# Patient Record
Sex: Female | Born: 1950
Health system: Southern US, Community
[De-identification: ages and names within clinical notes are randomized; demographics above are authoritative.]

## PROBLEM LIST (undated history)

## (undated) DIAGNOSIS — I10 Essential (primary) hypertension: Secondary | ICD-10-CM

## (undated) DIAGNOSIS — M810 Age-related osteoporosis without current pathological fracture: Secondary | ICD-10-CM

## (undated) HISTORY — DX: Essential (primary) hypertension: I10

## (undated) HISTORY — DX: Age-related osteoporosis without current pathological fracture: M81.0

## (undated) HISTORY — PX: NO PAST SURGERIES: SHX2092

---

## 2014-12-31 DIAGNOSIS — Z6831 Body mass index (BMI) 31.0-31.9, adult: Secondary | ICD-10-CM | POA: Diagnosis not present

## 2014-12-31 DIAGNOSIS — S30861A Insect bite (nonvenomous) of abdominal wall, initial encounter: Secondary | ICD-10-CM | POA: Diagnosis not present

## 2014-12-31 DIAGNOSIS — R509 Fever, unspecified: Secondary | ICD-10-CM | POA: Diagnosis not present

## 2015-01-14 DIAGNOSIS — Z0000 Encounter for general adult medical examination without abnormal findings: Secondary | ICD-10-CM | POA: Diagnosis not present

## 2015-01-14 DIAGNOSIS — Z1231 Encounter for screening mammogram for malignant neoplasm of breast: Secondary | ICD-10-CM | POA: Diagnosis not present

## 2015-01-14 DIAGNOSIS — I10 Essential (primary) hypertension: Secondary | ICD-10-CM | POA: Diagnosis not present

## 2015-01-14 DIAGNOSIS — Z720 Tobacco use: Secondary | ICD-10-CM | POA: Diagnosis not present

## 2015-01-14 DIAGNOSIS — Z6832 Body mass index (BMI) 32.0-32.9, adult: Secondary | ICD-10-CM | POA: Diagnosis not present

## 2015-01-14 DIAGNOSIS — Z716 Tobacco abuse counseling: Secondary | ICD-10-CM | POA: Diagnosis not present

## 2015-01-20 DIAGNOSIS — R1909 Other intra-abdominal and pelvic swelling, mass and lump: Secondary | ICD-10-CM | POA: Diagnosis not present

## 2015-01-20 DIAGNOSIS — Z1231 Encounter for screening mammogram for malignant neoplasm of breast: Secondary | ICD-10-CM | POA: Diagnosis not present

## 2015-01-20 DIAGNOSIS — Z1211 Encounter for screening for malignant neoplasm of colon: Secondary | ICD-10-CM | POA: Diagnosis not present

## 2015-01-20 DIAGNOSIS — I716 Thoracoabdominal aortic aneurysm, without rupture: Secondary | ICD-10-CM | POA: Diagnosis not present

## 2015-01-20 DIAGNOSIS — R1900 Intra-abdominal and pelvic swelling, mass and lump, unspecified site: Secondary | ICD-10-CM | POA: Diagnosis not present

## 2015-01-23 DIAGNOSIS — R928 Other abnormal and inconclusive findings on diagnostic imaging of breast: Secondary | ICD-10-CM | POA: Diagnosis not present

## 2015-01-23 DIAGNOSIS — R922 Inconclusive mammogram: Secondary | ICD-10-CM | POA: Diagnosis not present

## 2015-02-11 ENCOUNTER — Encounter: Payer: Self-pay | Admitting: Legal Medicine

## 2015-02-11 DIAGNOSIS — K621 Rectal polyp: Secondary | ICD-10-CM | POA: Diagnosis not present

## 2015-02-11 DIAGNOSIS — D128 Benign neoplasm of rectum: Secondary | ICD-10-CM | POA: Diagnosis not present

## 2015-02-11 DIAGNOSIS — K5730 Diverticulosis of large intestine without perforation or abscess without bleeding: Secondary | ICD-10-CM | POA: Diagnosis not present

## 2015-02-11 DIAGNOSIS — Z1211 Encounter for screening for malignant neoplasm of colon: Secondary | ICD-10-CM | POA: Diagnosis not present

## 2015-02-11 DIAGNOSIS — D125 Benign neoplasm of sigmoid colon: Secondary | ICD-10-CM | POA: Diagnosis not present

## 2015-02-11 LAB — HM COLONOSCOPY

## 2015-03-18 DIAGNOSIS — Z6831 Body mass index (BMI) 31.0-31.9, adult: Secondary | ICD-10-CM | POA: Diagnosis not present

## 2015-03-18 DIAGNOSIS — I10 Essential (primary) hypertension: Secondary | ICD-10-CM | POA: Diagnosis not present

## 2015-09-24 DIAGNOSIS — I10 Essential (primary) hypertension: Secondary | ICD-10-CM | POA: Diagnosis not present

## 2015-09-24 DIAGNOSIS — F17200 Nicotine dependence, unspecified, uncomplicated: Secondary | ICD-10-CM | POA: Diagnosis not present

## 2015-09-24 DIAGNOSIS — D649 Anemia, unspecified: Secondary | ICD-10-CM | POA: Diagnosis not present

## 2015-09-24 DIAGNOSIS — Z6831 Body mass index (BMI) 31.0-31.9, adult: Secondary | ICD-10-CM | POA: Diagnosis not present

## 2015-09-24 DIAGNOSIS — E669 Obesity, unspecified: Secondary | ICD-10-CM | POA: Diagnosis not present

## 2016-03-23 DIAGNOSIS — Z716 Tobacco abuse counseling: Secondary | ICD-10-CM | POA: Diagnosis not present

## 2016-03-23 DIAGNOSIS — Z6831 Body mass index (BMI) 31.0-31.9, adult: Secondary | ICD-10-CM | POA: Diagnosis not present

## 2016-03-23 DIAGNOSIS — E669 Obesity, unspecified: Secondary | ICD-10-CM | POA: Diagnosis not present

## 2016-03-23 DIAGNOSIS — I10 Essential (primary) hypertension: Secondary | ICD-10-CM | POA: Diagnosis not present

## 2016-03-23 DIAGNOSIS — E785 Hyperlipidemia, unspecified: Secondary | ICD-10-CM | POA: Diagnosis not present

## 2016-03-23 DIAGNOSIS — F17200 Nicotine dependence, unspecified, uncomplicated: Secondary | ICD-10-CM | POA: Diagnosis not present

## 2016-04-05 DIAGNOSIS — Z1231 Encounter for screening mammogram for malignant neoplasm of breast: Secondary | ICD-10-CM | POA: Diagnosis not present

## 2016-04-19 DIAGNOSIS — Z0000 Encounter for general adult medical examination without abnormal findings: Secondary | ICD-10-CM | POA: Diagnosis not present

## 2016-04-19 DIAGNOSIS — Z6831 Body mass index (BMI) 31.0-31.9, adult: Secondary | ICD-10-CM | POA: Diagnosis not present

## 2016-09-26 DIAGNOSIS — I10 Essential (primary) hypertension: Secondary | ICD-10-CM | POA: Diagnosis not present

## 2016-09-26 DIAGNOSIS — F5101 Primary insomnia: Secondary | ICD-10-CM | POA: Diagnosis not present

## 2017-03-27 DIAGNOSIS — I10 Essential (primary) hypertension: Secondary | ICD-10-CM | POA: Diagnosis not present

## 2017-03-27 DIAGNOSIS — M810 Age-related osteoporosis without current pathological fracture: Secondary | ICD-10-CM | POA: Diagnosis not present

## 2017-04-17 DIAGNOSIS — M8589 Other specified disorders of bone density and structure, multiple sites: Secondary | ICD-10-CM | POA: Diagnosis not present

## 2017-04-17 DIAGNOSIS — M810 Age-related osteoporosis without current pathological fracture: Secondary | ICD-10-CM | POA: Diagnosis not present

## 2017-04-17 LAB — HM DEXA SCAN

## 2017-04-19 DIAGNOSIS — Z0000 Encounter for general adult medical examination without abnormal findings: Secondary | ICD-10-CM | POA: Diagnosis not present

## 2017-04-19 DIAGNOSIS — Z6829 Body mass index (BMI) 29.0-29.9, adult: Secondary | ICD-10-CM | POA: Diagnosis not present

## 2017-04-19 DIAGNOSIS — Z23 Encounter for immunization: Secondary | ICD-10-CM | POA: Diagnosis not present

## 2017-09-27 DIAGNOSIS — M810 Age-related osteoporosis without current pathological fracture: Secondary | ICD-10-CM | POA: Diagnosis not present

## 2017-09-27 DIAGNOSIS — I10 Essential (primary) hypertension: Secondary | ICD-10-CM | POA: Diagnosis not present

## 2018-03-28 DIAGNOSIS — M810 Age-related osteoporosis without current pathological fracture: Secondary | ICD-10-CM | POA: Diagnosis not present

## 2018-03-28 DIAGNOSIS — I10 Essential (primary) hypertension: Secondary | ICD-10-CM | POA: Diagnosis not present

## 2018-03-28 DIAGNOSIS — Z6832 Body mass index (BMI) 32.0-32.9, adult: Secondary | ICD-10-CM | POA: Diagnosis not present

## 2018-04-24 DIAGNOSIS — Z23 Encounter for immunization: Secondary | ICD-10-CM | POA: Diagnosis not present

## 2018-04-24 DIAGNOSIS — Z0000 Encounter for general adult medical examination without abnormal findings: Secondary | ICD-10-CM | POA: Diagnosis not present

## 2018-04-24 DIAGNOSIS — Z6832 Body mass index (BMI) 32.0-32.9, adult: Secondary | ICD-10-CM | POA: Diagnosis not present

## 2018-04-30 DIAGNOSIS — Z23 Encounter for immunization: Secondary | ICD-10-CM | POA: Diagnosis not present

## 2018-05-15 DIAGNOSIS — Z1231 Encounter for screening mammogram for malignant neoplasm of breast: Secondary | ICD-10-CM | POA: Diagnosis not present

## 2018-09-26 DIAGNOSIS — M810 Age-related osteoporosis without current pathological fracture: Secondary | ICD-10-CM | POA: Diagnosis not present

## 2018-09-26 DIAGNOSIS — I10 Essential (primary) hypertension: Secondary | ICD-10-CM | POA: Diagnosis not present

## 2018-09-26 DIAGNOSIS — Z6833 Body mass index (BMI) 33.0-33.9, adult: Secondary | ICD-10-CM | POA: Diagnosis not present

## 2018-12-26 DIAGNOSIS — I10 Essential (primary) hypertension: Secondary | ICD-10-CM | POA: Diagnosis not present

## 2018-12-26 DIAGNOSIS — M810 Age-related osteoporosis without current pathological fracture: Secondary | ICD-10-CM | POA: Diagnosis not present

## 2018-12-26 DIAGNOSIS — Z1159 Encounter for screening for other viral diseases: Secondary | ICD-10-CM | POA: Diagnosis not present

## 2018-12-26 DIAGNOSIS — Z6834 Body mass index (BMI) 34.0-34.9, adult: Secondary | ICD-10-CM | POA: Diagnosis not present

## 2019-05-03 DIAGNOSIS — Z6834 Body mass index (BMI) 34.0-34.9, adult: Secondary | ICD-10-CM | POA: Diagnosis not present

## 2019-05-03 DIAGNOSIS — Z1159 Encounter for screening for other viral diseases: Secondary | ICD-10-CM | POA: Diagnosis not present

## 2019-05-03 DIAGNOSIS — I10 Essential (primary) hypertension: Secondary | ICD-10-CM | POA: Diagnosis not present

## 2019-05-03 DIAGNOSIS — M810 Age-related osteoporosis without current pathological fracture: Secondary | ICD-10-CM | POA: Diagnosis not present

## 2019-05-03 DIAGNOSIS — Z23 Encounter for immunization: Secondary | ICD-10-CM | POA: Diagnosis not present

## 2019-06-07 DIAGNOSIS — Z1231 Encounter for screening mammogram for malignant neoplasm of breast: Secondary | ICD-10-CM | POA: Diagnosis not present

## 2019-06-07 LAB — HM MAMMOGRAPHY

## 2019-09-03 ENCOUNTER — Ambulatory Visit (INDEPENDENT_AMBULATORY_CARE_PROVIDER_SITE_OTHER): Payer: Medicare HMO | Admitting: Legal Medicine

## 2019-09-03 ENCOUNTER — Other Ambulatory Visit: Payer: Self-pay

## 2019-09-03 ENCOUNTER — Encounter: Payer: Self-pay | Admitting: Legal Medicine

## 2019-09-03 VITALS — BP 130/78 | HR 110 | Temp 97.5°F | Resp 17 | Ht 59.45 in | Wt 182.0 lb

## 2019-09-03 DIAGNOSIS — M810 Age-related osteoporosis without current pathological fracture: Secondary | ICD-10-CM | POA: Diagnosis not present

## 2019-09-03 DIAGNOSIS — I10 Essential (primary) hypertension: Secondary | ICD-10-CM | POA: Diagnosis not present

## 2019-09-03 NOTE — Assessment & Plan Note (Signed)
An individual care plan was established and reinforced today.  The patient's status was assessed using clinical findings on exam and labs or diagnostic tests. The patient's success at meeting treatment goals on disease specific evidence-based guidelines and found to be good controlled. SELF MANAGEMENT: The patient and I together assessed ways to personally work towards obtaining the recommended goals. RECOMMENDATIONS: avoid decongestants found in common cold remedies, decrease consumption of alcohol, perform routine monitoring of BP with home BP cuff, exercise, reduction of dietary salt, take medicines as prescribed, try not to miss doses and quit smoking.  Regular exercise and maintaining a healthy weight is needed.  Stress reduction may help. A CLINICAL SUMMARY including written plan identify barriers to care unique to individual due to social or financial issues.  We attempt to mutually creat solutions for individual and family understanding. 

## 2019-09-03 NOTE — Progress Notes (Signed)
Established Patient Office Visit  Subjective:  Patient ID: Megan Christian, female    DOB: 1951-05-18  Age: 69 y.o. MRN: [id number]  CC:  Chief Complaint  Patient presents with  . Hypertension    HPI Megan Christian presents for chronic visit:  HYPERTENSION Patient presents for follow up of hypertension.  Patient tolerating lisinopril '10mg'$  well with side effects.  Patient was diagnosed hywith pertension 2016 so has been treated for hypertension for 4 years.Patient is working on maintaining diet and exercise regimen and follows up as directed. She is not having any complications of hypertension.  Past Medical History:  Diagnosis Date  . Hypertension, essential   . OP (osteoporosis)       Family History  Problem Relation Age of Onset  . Emphysema Mother     Social History   Socioeconomic History  . Marital status: Married    Spouse name: Not on file  . Number of children: Not on file  . Years of education: Not on file  . Highest education level: Not on file  Occupational History  . Occupation: Retired  Tobacco Use  . Smoking status: Former Smoker    Types: Cigarettes  . Smokeless tobacco: Never Used  Substance and Sexual Activity  . Alcohol use: Never  . Drug use: Never  . Sexual activity: Not Currently  Other Topics Concern  . Not on file  Social History Narrative  . Not on file   Social Determinants of Health   Financial Resource Strain:   . Difficulty of Paying Living Expenses: Not on file  Food Insecurity:   . Worried About Charity fundraiser in the Last Year: Not on file  . Ran Out of Food in the Last Year: Not on file  Transportation Needs:   . Lack of Transportation (Medical): Not on file  . Lack of Transportation (Non-Medical): Not on file  Physical Activity:   . Days of Exercise per Week: Not on file  . Minutes of Exercise per Session: Not on file  Stress:   . Feeling of Stress : Not on file  Social Connections:   . Frequency of  Communication with Friends and Family: Not on file  . Frequency of Social Gatherings with Friends and Family: Not on file  . Attends Religious Services: Not on file  . Active Member of Clubs or Organizations: Not on file  . Attends Archivist Meetings: Not on file  . Marital Status: Not on file  Intimate Partner Violence:   . Fear of Current or Ex-Partner: Not on file  . Emotionally Abused: Not on file  . Physically Abused: Not on file  . Sexually Abused: Not on file    Outpatient Medications Prior to Visit  Medication Sig Dispense Refill  . lisinopril (ZESTRIL) 10 MG tablet     . Multiple Vitamins-Iron (MULTIVITAMINS WITH IRON) TABS tablet Take 1 tablet by mouth daily.     No facility-administered medications prior to visit.    No Known Allergies  ROS Review of Systems  Constitutional: Negative.   HENT: Negative.   Eyes: Negative.   Respiratory: Negative.   Cardiovascular: Negative.   Gastrointestinal: Negative.   Genitourinary: Negative.   Musculoskeletal: Negative.       Objective:    Physical Exam  Constitutional: She is oriented to person, place, and time. She appears well-developed and well-nourished.  HENT:  Head: Normocephalic and atraumatic.  Eyes: Pupils are equal, round, and reactive to light. Conjunctivae and  EOM are normal.  Cardiovascular: Normal rate and regular rhythm.  Pulmonary/Chest: Effort normal and breath sounds normal.  Abdominal: Soft. Bowel sounds are normal.  Musculoskeletal:        General: Normal range of motion.     Cervical back: Normal range of motion and neck supple.  Neurological: She is alert and oriented to person, place, and time.  Skin: Skin is warm and dry.    BP 130/78 (BP Location: Right Arm, Patient Position: Sitting)   Pulse (!) 110   Temp (!) 97.5 F (36.4 C) (Temporal)   Resp 17   Ht 4' 11.45" (1.51 m)   Wt 182 lb (82.6 kg)   SpO2 96%   BMI 36.21 kg/m  Wt Readings from Last 3 Encounters:  09/03/19  182 lb (82.6 kg)     Health Maintenance Due  Topic Date Due  . Hepatitis C Screening  Jun 07, 1951  . TETANUS/TDAP  10/15/1967  . COLONOSCOPY  10/15/1998  . PNA vac Low Risk Adult (1 of 2 - PCV13) 10/14/2013    There are no preventive care reminders to display for this patient.  No results found for: TSH No results found for: WBC, HGB, HCT, MCV, PLT No results found for: NA, K, CHLORIDE, CO2, GLUCOSE, BUN, CREATININE, BILITOT, ALKPHOS, AST, ALT, PROT, ALBUMIN, CALCIUM, ANIONGAP, EGFR, GFR No results found for: CHOL No results found for: HDL No results found for: LDLCALC No results found for: TRIG No results found for: CHOLHDL No results found for: HGBA1C    Assessment & Plan:   Problem List Items Addressed This Visit      Cardiovascular and Mediastinum   Essential hypertension, benign - Primary (Chronic)    An individual care plan was established and reinforced today.  The patient's status was assessed using clinical findings on exam and labs or diagnostic tests. The patient's success at meeting treatment goals on disease specific evidence-based guidelines and found to be good controlled. SELF MANAGEMENT: The patient and I together assessed ways to personally work towards obtaining the recommended goals. RECOMMENDATIONS: avoid decongestants found in common cold remedies, decrease consumption of alcohol, perform routine monitoring of BP with home BP cuff, exercise, reduction of dietary salt, take medicines as prescribed, try not to miss doses and quit smoking.  Regular exercise and maintaining a healthy weight is needed.  Stress reduction may help. A CLINICAL SUMMARY including written plan identify barriers to care unique to individual due to social or financial issues.  We attempt to mutually creat solutions for individual and family understanding.      Relevant Medications   lisinopril (ZESTRIL) 10 MG tablet   Other Relevant Orders   CBC with Differential   COMPLETE METABOLIC  PANEL WITH GFR     Musculoskeletal and Integument   Osteoporosis (Chronic)    AN INDIVIDUAL CARE PLAN was established and reinforced today.  The patient's status was assessed using clinical findings on exam, labs, and other diagnostic testing. Patient's success at meeting treatment goals based on disease specific evidence-bassed guidelines and found to be in good control. RECOMMENDATIONS include maintaining present medicines and treatment.  Continue calcium and vitamin D.  Get routine DEXA scans         No orders of the defined types were placed in this encounter.   Follow-up: Return in about 4 months (around 01/01/2020).    Reinaldo Meeker, MD

## 2019-09-03 NOTE — Assessment & Plan Note (Signed)
AN INDIVIDUAL CARE PLAN was established and reinforced today.  The patient's status was assessed using clinical findings on exam, labs, and other diagnostic testing. Patient's success at meeting treatment goals based on disease specific evidence-bassed guidelines and found to be in good control. RECOMMENDATIONS include maintaining present medicines and treatment.  Continue calcium and vitamin D.  Get routine DEXA scans

## 2019-09-04 ENCOUNTER — Telehealth: Payer: Self-pay

## 2019-09-04 ENCOUNTER — Other Ambulatory Visit: Payer: Self-pay

## 2019-09-04 DIAGNOSIS — I10 Essential (primary) hypertension: Secondary | ICD-10-CM

## 2019-09-04 LAB — CBC WITH DIFFERENTIAL/PLATELET
Basophils Absolute: 0.1 10*3/uL (ref 0.0–0.2)
Basos: 1 %
EOS (ABSOLUTE): 0.2 10*3/uL (ref 0.0–0.4)
Eos: 2 %
Hematocrit: 45.4 % (ref 34.0–46.6)
Hemoglobin: 14.6 g/dL (ref 11.1–15.9)
Immature Grans (Abs): 0 10*3/uL (ref 0.0–0.1)
Immature Granulocytes: 0 %
Lymphocytes Absolute: 2.4 10*3/uL (ref 0.7–3.1)
Lymphs: 28 %
MCH: 27.3 pg (ref 26.6–33.0)
MCHC: 32.2 g/dL (ref 31.5–35.7)
MCV: 85 fL (ref 79–97)
Monocytes Absolute: 0.7 10*3/uL (ref 0.1–0.9)
Monocytes: 8 %
Neutrophils Absolute: 5.3 10*3/uL (ref 1.4–7.0)
Neutrophils: 61 %
Platelets: 276 10*3/uL (ref 150–450)
RBC: 5.34 x10E6/uL — ABNORMAL HIGH (ref 3.77–5.28)
RDW: 13.3 % (ref 11.7–15.4)
WBC: 8.7 10*3/uL (ref 3.4–10.8)

## 2019-09-04 NOTE — Telephone Encounter (Signed)
Patient was notified.

## 2019-09-04 NOTE — Progress Notes (Signed)
Cbc normal lp

## 2019-09-04 NOTE — Addendum Note (Signed)
Addended by: Thompson Caul I on: 09/04/2019 03:27 PM   Modules accepted: Orders

## 2019-09-05 ENCOUNTER — Other Ambulatory Visit: Payer: Self-pay

## 2019-09-05 ENCOUNTER — Other Ambulatory Visit: Payer: Medicare HMO

## 2019-09-05 DIAGNOSIS — I10 Essential (primary) hypertension: Secondary | ICD-10-CM | POA: Diagnosis not present

## 2019-09-06 ENCOUNTER — Telehealth: Payer: Self-pay

## 2019-09-06 LAB — COMPREHENSIVE METABOLIC PANEL
ALT: 18 IU/L (ref 0–32)
AST: 22 IU/L (ref 0–40)
Albumin/Globulin Ratio: 1.9 (ref 1.2–2.2)
Albumin: 4.5 g/dL (ref 3.8–4.8)
Alkaline Phosphatase: 58 IU/L (ref 39–117)
BUN/Creatinine Ratio: 17 (ref 12–28)
BUN: 15 mg/dL (ref 8–27)
Bilirubin Total: 0.3 mg/dL (ref 0.0–1.2)
CO2: 22 mmol/L (ref 20–29)
Calcium: 9.2 mg/dL (ref 8.7–10.3)
Chloride: 104 mmol/L (ref 96–106)
Creatinine, Ser: 0.89 mg/dL (ref 0.57–1.00)
GFR calc Af Amer: 76 mL/min/{1.73_m2} (ref >59)
GFR calc non Af Amer: 66 mL/min/{1.73_m2} (ref >59)
Globulin, Total: 2.4 g/dL (ref 1.5–4.5)
Glucose: 96 mg/dL (ref 65–99)
Potassium: 4.9 mmol/L (ref 3.5–5.2)
Sodium: 143 mmol/L (ref 134–144)
Total Protein: 6.9 g/dL (ref 6.0–8.5)

## 2019-09-06 NOTE — Progress Notes (Signed)
Kidney and liver tests normal, postassium normal

## 2019-09-06 NOTE — Telephone Encounter (Signed)
PATIENT WAS INFORMED

## 2019-12-31 ENCOUNTER — Encounter: Payer: Self-pay | Admitting: Legal Medicine

## 2020-01-01 ENCOUNTER — Encounter: Payer: Self-pay | Admitting: Legal Medicine

## 2020-01-01 ENCOUNTER — Ambulatory Visit (INDEPENDENT_AMBULATORY_CARE_PROVIDER_SITE_OTHER): Payer: Medicare HMO | Admitting: Legal Medicine

## 2020-01-01 ENCOUNTER — Other Ambulatory Visit: Payer: Self-pay

## 2020-01-01 VITALS — BP 120/78 | HR 89 | Temp 97.1°F | Resp 16 | Ht 59.0 in | Wt 180.8 lb

## 2020-01-01 DIAGNOSIS — Z6837 Body mass index (BMI) 37.0-37.9, adult: Secondary | ICD-10-CM | POA: Insufficient documentation

## 2020-01-01 DIAGNOSIS — I10 Essential (primary) hypertension: Secondary | ICD-10-CM | POA: Diagnosis not present

## 2020-01-01 DIAGNOSIS — Z6836 Body mass index (BMI) 36.0-36.9, adult: Secondary | ICD-10-CM | POA: Diagnosis not present

## 2020-01-01 DIAGNOSIS — M810 Age-related osteoporosis without current pathological fracture: Secondary | ICD-10-CM

## 2020-01-01 LAB — COMPREHENSIVE METABOLIC PANEL
ALT: 18 IU/L (ref 0–32)
AST: 21 IU/L (ref 0–40)
Albumin/Globulin Ratio: 1.8 (ref 1.2–2.2)
Albumin: 4.4 g/dL (ref 3.7–4.7)
Alkaline Phosphatase: 54 IU/L (ref 48–121)
BUN/Creatinine Ratio: 11 — ABNORMAL LOW (ref 12–28)
BUN: 11 mg/dL (ref 8–27)
Bilirubin Total: 0.4 mg/dL (ref 0.0–1.2)
CO2: 22 mmol/L (ref 20–29)
Calcium: 9.5 mg/dL (ref 8.7–10.3)
Chloride: 106 mmol/L (ref 96–106)
Creatinine, Ser: 1 mg/dL (ref 0.57–1.00)
GFR calc Af Amer: 65 mL/min/{1.73_m2} (ref >59)
GFR calc non Af Amer: 57 mL/min/{1.73_m2} — ABNORMAL LOW (ref >59)
Globulin, Total: 2.5 g/dL (ref 1.5–4.5)
Glucose: 90 mg/dL (ref 65–99)
Potassium: 4.6 mmol/L (ref 3.5–5.2)
Sodium: 142 mmol/L (ref 134–144)
Total Protein: 6.9 g/dL (ref 6.0–8.5)

## 2020-01-01 LAB — CBC WITH DIFFERENTIAL/PLATELET
Basophils Absolute: 0.1 10*3/uL (ref 0.0–0.2)
Basos: 1 %
EOS (ABSOLUTE): 0.2 10*3/uL (ref 0.0–0.4)
Eos: 2 %
Hematocrit: 42.2 % (ref 34.0–46.6)
Hemoglobin: 14 g/dL (ref 11.1–15.9)
Immature Grans (Abs): 0 10*3/uL (ref 0.0–0.1)
Immature Granulocytes: 0 %
Lymphocytes Absolute: 2 10*3/uL (ref 0.7–3.1)
Lymphs: 30 %
MCH: 27.4 pg (ref 26.6–33.0)
MCHC: 33.2 g/dL (ref 31.5–35.7)
MCV: 83 fL (ref 79–97)
Monocytes Absolute: 0.6 10*3/uL (ref 0.1–0.9)
Monocytes: 9 %
Neutrophils Absolute: 3.9 10*3/uL (ref 1.4–7.0)
Neutrophils: 58 %
Platelets: 291 10*3/uL (ref 150–450)
RBC: 5.11 x10E6/uL (ref 3.77–5.28)
RDW: 13.6 % (ref 11.7–15.4)
WBC: 6.8 10*3/uL (ref 3.4–10.8)

## 2020-01-01 NOTE — Progress Notes (Signed)
Established Patient Office Visit  Subjective:  Patient ID: Megan Christian, female    DOB: Sep 02, 1950  Age: 69 y.o. MRN: [id number]  CC:  Chief Complaint  Patient presents with  . Hypertension    HPI Megan Christian presents for Chronic visit.  Patient presents for follow up of hypertension.  Patient tolerating lisinopril well with side effects.  Patient was diagnosed with hypertension 2010 so has been treated for hypertension for 10 years.Patient is working on maintaining diet and exercise regimen and follows up as directed. Complication include none.  History reviewed. No pertinent past medical history.  History reviewed. No pertinent surgical history.  Family History  Problem Relation Age of Onset  . Emphysema Father   . Diabetes Sister   . Diabetes Brother     Social History   Socioeconomic History  . Marital status: Married    Spouse name: Not on file  . Number of children: Not on file  . Years of education: Not on file  . Highest education level: Not on file  Occupational History  . Occupation: Retired  Tobacco Use  . Smoking status: Former Smoker    Types: Cigarettes  . Smokeless tobacco: Never Used  Substance and Sexual Activity  . Alcohol use: Never  . Drug use: Never  . Sexual activity: Not Currently  Other Topics Concern  . Not on file  Social History Narrative  . Not on file   Social Determinants of Health   Financial Resource Strain:   . Difficulty of Paying Living Expenses:   Food Insecurity:   . Worried About Charity fundraiser in the Last Year:   . Arboriculturist in the Last Year:   Transportation Needs:   . Film/video editor (Medical):   Marland Kitchen Lack of Transportation (Non-Medical):   Physical Activity:   . Days of Exercise per Week:   . Minutes of Exercise per Session:   Stress:   . Feeling of Stress :   Social Connections:   . Frequency of Communication with Friends and Family:   . Frequency of Social Gatherings with Friends and  Family:   . Attends Religious Services:   . Active Member of Clubs or Organizations:   . Attends Archivist Meetings:   Marland Kitchen Marital Status:   Intimate Partner Violence:   . Fear of Current or Ex-Partner:   . Emotionally Abused:   Marland Kitchen Physically Abused:   . Sexually Abused:     Outpatient Medications Prior to Visit  Medication Sig Dispense Refill  . lisinopril (ZESTRIL) 10 MG tablet     . Multiple Vitamins-Iron (MULTIVITAMINS WITH IRON) TABS tablet Take 1 tablet by mouth daily.     No facility-administered medications prior to visit.    No Known Allergies  ROS Review of Systems  Constitutional: Negative.   HENT: Negative.   Eyes: Negative.   Respiratory: Negative.   Cardiovascular: Negative.   Gastrointestinal: Negative.   Endocrine: Negative.   Genitourinary: Negative.   Musculoskeletal: Negative.   Skin: Negative.   Neurological: Negative.   Psychiatric/Behavioral: Negative.       Objective:    Physical Exam  BP (!) 142/72   Pulse 89   Temp (!) 97.1 F (36.2 C)   Resp 16   Ht 4\' 11"  (1.499 m)   Wt 180 lb 12.8 oz (82 kg)   SpO2 99%   BMI 36.52 kg/m  Wt Readings from Last 3 Encounters:  01/01/20 180 lb 12.8  oz (82 kg)  09/03/19 182 lb (82.6 kg)     Health Maintenance Due  Topic Date Due  . Hepatitis C Screening  Never done  . COVID-19 Vaccine (1) Never done  . TETANUS/TDAP  Never done  . COLONOSCOPY  Never done  . PNA vac Low Risk Adult (1 of 2 - PCV13) Never done    There are no preventive care reminders to display for this patient.  No results found for: TSH Lab Results  Component Value Date   WBC 8.7 09/03/2019   HGB 14.6 09/03/2019   HCT 45.4 09/03/2019   MCV 85 09/03/2019   PLT 276 09/03/2019   Lab Results  Component Value Date   NA 143 09/05/2019   K 4.9 09/05/2019   CO2 22 09/05/2019   GLUCOSE 96 09/05/2019   BUN 15 09/05/2019   CREATININE 0.89 09/05/2019   BILITOT 0.3 09/05/2019   ALKPHOS 58 09/05/2019   AST 22  09/05/2019   ALT 18 09/05/2019   PROT 6.9 09/05/2019   ALBUMIN 4.5 09/05/2019   CALCIUM 9.2 09/05/2019   No results found for: CHOL No results found for: HDL No results found for: LDLCALC No results found for: TRIG No results found for: CHOLHDL No results found for: HGBA1C    Assessment & Plan:   Hypertension: An individual hypertension care plan was established and reinforced today.  The patient's status was assessed using clinical findings on exam and labs or diagnostic tests. The patient's success at meeting treatment goals on disease specific evidence-based guidelines and found to be well controlled. SELF MANAGEMENT: The patient and I together assessed ways to personally work towards obtaining the recommended goals. RECOMMENDATIONS: avoid decongestants found in common cold remedies, decrease consumption of alcohol, perform routine monitoring of BP with home BP cuff, exercise, reduction of dietary salt, take medicines as prescribed, try not to miss doses and quit smoking.  Regular exercise and maintaining a healthy weight is needed.  Stress reduction may help. A CLINICAL SUMMARY including written plan identify barriers to care unique to individual due to social or financial issues.  We attempt to mutually creat solutions for individual and family understanding.  Osteoporosis: She needs repeat Dexa scan, we discussed Prolia.  BMI 36: An individualize plan was formulated for obesity using patient history and physical exam to encourage weight loss.  An evidence based program was formulated.  Patient is to cut portion size with meals and to plan physical exercise 3 days a week at least 20 minutes.  Weight watchers and other programs are helpful.  Planned amount of weight loss 10 lbs.  No orders of the defined types were placed in this encounter.   Follow-up: Return in about 6 months (around 07/02/2020) for fasing.    Reinaldo Meeker, MD

## 2020-01-02 NOTE — Progress Notes (Signed)
CBC normal, kidney tests slightly low, liver tests ok,  lp

## 2020-04-28 ENCOUNTER — Other Ambulatory Visit: Payer: Self-pay | Admitting: Legal Medicine

## 2020-04-28 DIAGNOSIS — I10 Essential (primary) hypertension: Secondary | ICD-10-CM

## 2020-07-02 ENCOUNTER — Encounter: Payer: Self-pay | Admitting: Legal Medicine

## 2020-07-02 ENCOUNTER — Other Ambulatory Visit: Payer: Self-pay

## 2020-07-02 ENCOUNTER — Ambulatory Visit (INDEPENDENT_AMBULATORY_CARE_PROVIDER_SITE_OTHER): Payer: Medicare HMO | Admitting: Legal Medicine

## 2020-07-02 VITALS — BP 130/70 | HR 96 | Temp 97.3°F | Ht 59.0 in | Wt 180.0 lb

## 2020-07-02 DIAGNOSIS — I10 Essential (primary) hypertension: Secondary | ICD-10-CM

## 2020-07-02 DIAGNOSIS — M810 Age-related osteoporosis without current pathological fracture: Secondary | ICD-10-CM

## 2020-07-02 DIAGNOSIS — E782 Mixed hyperlipidemia: Secondary | ICD-10-CM | POA: Diagnosis not present

## 2020-07-02 DIAGNOSIS — Z6836 Body mass index (BMI) 36.0-36.9, adult: Secondary | ICD-10-CM | POA: Diagnosis not present

## 2020-07-02 NOTE — Progress Notes (Signed)
Subjective:  Patient ID: Megan Christian, female    DOB: [birth year]  Age: 69 y.o. MRN: [id number]  Chief Complaint  Patient presents with  . Hypertension    HPI: Chronic visit  Patient presents for follow up of hypertension.  Patient tolerating lisinopril well with side effects.  Patient was diagnosed with hypertension 2010 so has been treated for hypertension for 10 years.Patient is working on maintaining diet and exercise regimen and follows up as directed. Complication include none.  Patient has osteoporosis she is on calcium and vitamin d   Current Outpatient Medications on File Prior to Visit  Medication Sig Dispense Refill  . lisinopril (ZESTRIL) 10 MG tablet TAKE 1 TABLET BY MOUTH DAILY 90 tablet 2  . Multiple Vitamins-Iron (MULTIVITAMINS WITH IRON) TABS tablet Take 1 tablet by mouth daily.     No current facility-administered medications on file prior to visit.   History reviewed. No pertinent past medical history. History reviewed. No pertinent surgical history.  Family History  Problem Relation Age of Onset  . Emphysema Father   . Diabetes Sister   . Diabetes Brother    Social History   Socioeconomic History  . Marital status: Married    Spouse name: Not on file  . Number of children: Not on file  . Years of education: Not on file  . Highest education level: Not on file  Occupational History  . Occupation: Retired  Tobacco Use  . Smoking status: Former Smoker    Types: Cigarettes  . Smokeless tobacco: Never Used  Vaping Use  . Vaping Use: Never used  Substance and Sexual Activity  . Alcohol use: Never  . Drug use: Never  . Sexual activity: Not Currently  Other Topics Concern  . Not on file  Social History Narrative  . Not on file   Social Determinants of Health   Financial Resource Strain:   . Difficulty of Paying Living Expenses: Not on file  Food Insecurity:   . Worried About Charity fundraiser in the Last Year: Not on file  . Ran Out of Food  in the Last Year: Not on file  Transportation Needs:   . Lack of Transportation (Medical): Not on file  . Lack of Transportation (Non-Medical): Not on file  Physical Activity:   . Days of Exercise per Week: Not on file  . Minutes of Exercise per Session: Not on file  Stress:   . Feeling of Stress : Not on file  Social Connections:   . Frequency of Communication with Friends and Family: Not on file  . Frequency of Social Gatherings with Friends and Family: Not on file  . Attends Religious Services: Not on file  . Active Member of Clubs or Organizations: Not on file  . Attends Archivist Meetings: Not on file  . Marital Status: Not on file    Review of Systems  Constitutional: Negative for chills, fatigue and fever.  HENT: Negative for congestion, ear pain, rhinorrhea and sore throat.   Eyes: Negative for visual disturbance.  Respiratory: Negative for cough and shortness of breath.   Cardiovascular: Negative for chest pain.  Gastrointestinal: Negative for abdominal pain, constipation, diarrhea, nausea and vomiting.  Endocrine: Negative for polyuria.  Genitourinary: Negative for dysuria and urgency.  Musculoskeletal: Negative for back pain and myalgias.  Neurological: Negative for dizziness, weakness, light-headedness and headaches.  Psychiatric/Behavioral: Negative for dysphoric mood. The patient is not nervous/anxious.      Objective:  BP 130/70  Pulse 96   Temp (!) 97.3 F (36.3 C)   Ht 4\' 11"  (1.499 m)   Wt 180 lb (81.6 kg)   SpO2 99%   BMI 36.36 kg/m   BP/Weight 07/02/2020 04/02/3299 02/03/2262  Systolic BP 335 456 256  Diastolic BP 70 78 78  Wt. (Lbs) 180 180.8 182  BMI 36.36 36.52 36.21    Physical Exam Vitals reviewed.  Constitutional:      Appearance: Normal appearance. She is obese.  HENT:     Head: Normocephalic and atraumatic.     Right Ear: Tympanic membrane and ear canal normal.     Left Ear: Tympanic membrane, ear canal and external ear  normal.     Mouth/Throat:     Mouth: Mucous membranes are moist.     Pharynx: Oropharynx is clear.  Eyes:     Extraocular Movements: Extraocular movements intact.     Conjunctiva/sclera: Conjunctivae normal.     Pupils: Pupils are equal, round, and reactive to light.  Cardiovascular:     Rate and Rhythm: Normal rate and regular rhythm.     Pulses: Normal pulses.     Heart sounds: Normal heart sounds.  Pulmonary:     Effort: Pulmonary effort is normal.     Breath sounds: Normal breath sounds.  Abdominal:     General: Abdomen is flat. Bowel sounds are normal.     Palpations: Abdomen is soft.  Musculoskeletal:        General: Normal range of motion.     Cervical back: Normal range of motion and neck supple.  Skin:    General: Skin is warm and dry.     Capillary Refill: Capillary refill takes less than 2 seconds.  Neurological:     General: No focal deficit present.     Mental Status: She is alert and oriented to person, place, and time. Mental status is at baseline.  Psychiatric:        Mood and Affect: Mood normal.        Behavior: Behavior normal.        Thought Content: Thought content normal.       Lab Results  Component Value Date   WBC 7.3 07/02/2020   HGB 14.4 07/02/2020   HCT 43.6 07/02/2020   PLT 251 07/02/2020   GLUCOSE 95 07/02/2020   CHOL 210 (H) 07/02/2020   TRIG 110 07/02/2020   HDL 59 07/02/2020   LDLCALC 131 (H) 07/02/2020   ALT 24 07/02/2020   AST 26 07/02/2020   NA 141 07/02/2020   K 4.5 07/02/2020   CL 103 07/02/2020   CREATININE 0.96 07/02/2020   BUN 13 07/02/2020   CO2 22 07/02/2020      Assessment & Plan:   1. Essential hypertension, benign - Comprehensive metabolic panel - Lipid panel - CBC with Differential/Platelet An individual hypertension care plan was established and reinforced today.  The patient's status was assessed using clinical findings on exam and labs or diagnostic tests. The patient's success at meeting treatment  goals on disease specific evidence-based guidelines and found to be well controlled. SELF MANAGEMENT: The patient and I together assessed ways to personally work towards obtaining the recommended goals. RECOMMENDATIONS: avoid decongestants found in common cold remedies, decrease consumption of alcohol, perform routine monitoring of BP with home BP cuff, exercise, reduction of dietary salt, take medicines as prescribed, try not to miss doses and quit smoking.  Regular exercise and maintaining a healthy weight is needed.  Stress reduction  may help. A CLINICAL SUMMARY including written plan identify barriers to care unique to individual due to social or financial issues.  We attempt to mutually creat solutions for individual and family understanding.  2. Age-related osteoporosis without current pathological fracture AN INDIVIDUAL CARE PLAN osteoporosis was established and reinforced today.  The patient's status was assessed using clinical findings on exam, labs, and other diagnostic testing. Patient's success at meeting treatment goals based on disease specific evidence-bassed guidelines and found to be in fair control. RECOMMENDATIONS include maintaining present medicines and treatment.  3. BMI 36.0-36.9,adult An individualize plan was formulated for obesity using patient history and physical exam to encourage weight loss.  An evidence based program was formulated.  Patient is to cut portion size with meals and to plan physical exercise 3 days a week at least 20 minutes.  Weight watchers and other programs are helpful.  Planned amount of weight loss 10 lbs.patient meets the criteria for morbid obesity with hypertension and hyperlipidemia  4. Mixed hyperlipidemia AN INDIVIDUAL CARE PLAN for hyperlipidemia/ cholesterol was established and reinforced today.  The patient's status was assessed using clinical findings on exam, lab and other diagnostic tests. The patient's disease status was assessed based on  evidence-based guidelines and found to be well controlled. MEDICATIONS were reviewed. SELF MANAGEMENT GOALS have been discussed and patient's success at attaining the goal of low cholesterol was assessed. RECOMMENDATION given include regular exercise 3 days a week and low cholesterol/low fat diet. CLINICAL SUMMARY including written plan to identify barriers unique to the patient due to social or economic  reasons was discussed. 5. Morbid obesity An individualize plan was formulated for obesity using patient history and physical exam to encourage weight loss.  An evidence based program was formulated.  Patient is to cut portion ize with meals and to plan physical exercise 3 days a week at least 20 minutes.  Weight watchers and other programs are helpful.  Planned amount of weight loss 10 lbs.      Orders Placed This Encounter  Procedures  . Comprehensive metabolic panel  . Lipid panel  . CBC with Differential/Platelet  . Cardiovascular Risk Assessment        Follow-up: Return in about 3 months (around 09/30/2020).  An After Visit Summary was printed and given to the patient.  Reinaldo Meeker, MD [facility] [phone]

## 2020-07-03 ENCOUNTER — Other Ambulatory Visit: Payer: Self-pay

## 2020-07-03 ENCOUNTER — Encounter: Payer: Self-pay | Admitting: Legal Medicine

## 2020-07-03 DIAGNOSIS — E782 Mixed hyperlipidemia: Secondary | ICD-10-CM | POA: Insufficient documentation

## 2020-07-03 LAB — COMPREHENSIVE METABOLIC PANEL
ALT: 24 IU/L (ref 0–32)
AST: 26 IU/L (ref 0–40)
Albumin/Globulin Ratio: 1.8 (ref 1.2–2.2)
Albumin: 4.8 g/dL — ABNORMAL HIGH (ref 3.7–4.7)
Alkaline Phosphatase: 55 IU/L (ref 44–121)
BUN/Creatinine Ratio: 14 (ref 12–28)
BUN: 13 mg/dL (ref 8–27)
Bilirubin Total: 0.3 mg/dL (ref 0.0–1.2)
CO2: 22 mmol/L (ref 20–29)
Calcium: 9.4 mg/dL (ref 8.7–10.3)
Chloride: 103 mmol/L (ref 96–106)
Creatinine, Ser: 0.96 mg/dL (ref 0.57–1.00)
GFR calc Af Amer: 69 mL/min/{1.73_m2} (ref >59)
GFR calc non Af Amer: 60 mL/min/{1.73_m2} (ref >59)
Globulin, Total: 2.6 g/dL (ref 1.5–4.5)
Glucose: 95 mg/dL (ref 65–99)
Potassium: 4.5 mmol/L (ref 3.5–5.2)
Sodium: 141 mmol/L (ref 134–144)
Total Protein: 7.4 g/dL (ref 6.0–8.5)

## 2020-07-03 LAB — LIPID PANEL
Chol/HDL Ratio: 3.6 ratio (ref 0.0–4.4)
Cholesterol, Total: 210 mg/dL — ABNORMAL HIGH (ref 100–199)
HDL: 59 mg/dL (ref >39)
LDL Chol Calc (NIH): 131 mg/dL — ABNORMAL HIGH (ref 0–99)
Triglycerides: 110 mg/dL (ref 0–149)
VLDL Cholesterol Cal: 20 mg/dL (ref 5–40)

## 2020-07-03 LAB — CBC WITH DIFFERENTIAL/PLATELET
Basophils Absolute: 0.1 10*3/uL (ref 0.0–0.2)
Basos: 1 %
EOS (ABSOLUTE): 0.2 10*3/uL (ref 0.0–0.4)
Eos: 3 %
Hematocrit: 43.6 % (ref 34.0–46.6)
Hemoglobin: 14.4 g/dL (ref 11.1–15.9)
Immature Grans (Abs): 0 10*3/uL (ref 0.0–0.1)
Immature Granulocytes: 0 %
Lymphocytes Absolute: 2.3 10*3/uL (ref 0.7–3.1)
Lymphs: 32 %
MCH: 28.1 pg (ref 26.6–33.0)
MCHC: 33 g/dL (ref 31.5–35.7)
MCV: 85 fL (ref 79–97)
Monocytes Absolute: 0.6 10*3/uL (ref 0.1–0.9)
Monocytes: 8 %
Neutrophils Absolute: 4.1 10*3/uL (ref 1.4–7.0)
Neutrophils: 56 %
Platelets: 251 10*3/uL (ref 150–450)
RBC: 5.13 x10E6/uL (ref 3.77–5.28)
RDW: 13.4 % (ref 11.7–15.4)
WBC: 7.3 10*3/uL (ref 3.4–10.8)

## 2020-07-03 LAB — CARDIOVASCULAR RISK ASSESSMENT

## 2020-07-03 MED ORDER — PRAVASTATIN SODIUM 40 MG PO TABS: 40.0000 mg | ORAL_TABLET | Freq: Every day | ORAL | 0 refills | Status: DC

## 2020-07-03 NOTE — Progress Notes (Signed)
Kidney and liver tests normal, potassium normal, LDL cholesterol 131 still suggest a statin to lower cardiac risk, CBC normal,  lp

## 2020-09-28 ENCOUNTER — Other Ambulatory Visit: Payer: Self-pay | Admitting: Legal Medicine

## 2020-10-02 ENCOUNTER — Encounter: Payer: Self-pay | Admitting: Legal Medicine

## 2020-10-02 ENCOUNTER — Ambulatory Visit (INDEPENDENT_AMBULATORY_CARE_PROVIDER_SITE_OTHER): Payer: Medicare HMO | Admitting: Legal Medicine

## 2020-10-02 ENCOUNTER — Other Ambulatory Visit: Payer: Self-pay

## 2020-10-02 VITALS — BP 134/70 | HR 100 | Temp 97.5°F | Resp 16 | Ht 59.0 in | Wt 187.0 lb

## 2020-10-02 DIAGNOSIS — I10 Essential (primary) hypertension: Secondary | ICD-10-CM

## 2020-10-02 DIAGNOSIS — E782 Mixed hyperlipidemia: Secondary | ICD-10-CM

## 2020-10-02 DIAGNOSIS — M810 Age-related osteoporosis without current pathological fracture: Secondary | ICD-10-CM | POA: Diagnosis not present

## 2020-10-02 NOTE — Progress Notes (Signed)
Subjective:  Patient ID: Megan Christian, female    DOB: [birth year]  Age: 70 y.o. MRN: [id number]  Chief Complaint  Patient presents with  . Hyperlipidemia  . Hypertension    HPI: chronic visit Patient presents for follow up of hypertension.  Patient tolerating lisinopril well with side effects.  Patient was diagnosed with hypertension 2010 so has been treated for hypertension for 10 years.Patient is working on maintaining diet and exercise regimen and follows up as directed. Complication include none.  Patient presents with hyperlipidemia.  Compliance with treatment has been good; patient takes medicines as directed, maintains low cholesterol diet, follows up as directed, and maintains exercise regimen.  Patient is using pravasttin without problems.   Current Outpatient Medications on File Prior to Visit  Medication Sig Dispense Refill  . lisinopril (ZESTRIL) 10 MG tablet TAKE 1 TABLET BY MOUTH DAILY 90 tablet 2  . Multiple Vitamins-Iron (MULTIVITAMINS WITH IRON) TABS tablet Take 1 tablet by mouth daily.    . pravastatin (PRAVACHOL) 40 MG tablet TAKE 1 TABLET(40 MG) BY MOUTH DAILY 90 tablet 2   No current facility-administered medications on file prior to visit.   History reviewed. No pertinent past medical history. Past Surgical History:  Procedure Laterality Date  . NO PAST SURGERIES      Family History  Problem Relation Age of Onset  . Emphysema Father   . Diabetes Sister   . Diabetes Brother    Social History   Socioeconomic History  . Marital status: Married    Spouse name: Not on file  . Number of children: Not on file  . Years of education: Not on file  . Highest education level: Not on file  Occupational History  . Occupation: Retired  Tobacco Use  . Smoking status: Former Smoker    Types: Cigarettes    Quit date: 2016    Years since quitting: 6.1  . Smokeless tobacco: Never Used  Vaping Use  . Vaping Use: Never used  Substance and Sexual Activity  .  Alcohol use: Never  . Drug use: Never  . Sexual activity: Not Currently  Other Topics Concern  . Not on file  Social History Narrative  . Not on file   Social Determinants of Health   Financial Resource Strain: Not on file  Food Insecurity: Not on file  Transportation Needs: Not on file  Physical Activity: Not on file  Stress: Not on file  Social Connections: Not on file    Review of Systems  Constitutional: Negative for activity change, appetite change and unexpected weight change.  HENT: Negative.  Negative for congestion and sinus pain.   Eyes: Negative for visual disturbance.  Respiratory: Negative.  Negative for chest tightness and shortness of breath.   Cardiovascular: Negative for chest pain, palpitations and leg swelling.  Gastrointestinal: Negative.  Negative for abdominal distention and abdominal pain.  Endocrine: Negative for polyuria.  Genitourinary: Negative.  Negative for difficulty urinating, dysuria and urgency.  Musculoskeletal: Negative.  Negative for arthralgias and back pain.  Skin: Negative.   Neurological: Negative.   Psychiatric/Behavioral: Negative.      Objective:  BP 134/70   Pulse 100   Temp (!) 97.5 F (36.4 C)   Resp 16   Ht 4\' 11"  (1.499 m)   Wt 187 lb (84.8 kg)   SpO2 97%   BMI 37.77 kg/m   BP/Weight 10/02/2020 97/12/7339 04/03/7901  Systolic BP 409 735 329  Diastolic BP 70 70 78  Wt. (Lbs) 187  180 180.8  BMI 37.77 36.36 36.52    Physical Exam Vitals reviewed.  Constitutional:      General: She is not in acute distress.    Appearance: Normal appearance.  HENT:     Right Ear: Tympanic membrane, ear canal and external ear normal.     Left Ear: Tympanic membrane, ear canal and external ear normal.     Nose: Nose normal.     Mouth/Throat:     Mouth: Mucous membranes are moist.     Pharynx: Oropharynx is clear.  Eyes:     Extraocular Movements: Extraocular movements intact.     Conjunctiva/sclera: Conjunctivae normal.      Pupils: Pupils are equal, round, and reactive to light.  Cardiovascular:     Rate and Rhythm: Normal rate and regular rhythm.     Pulses: Normal pulses.     Heart sounds: No murmur heard. No gallop.   Pulmonary:     Effort: Pulmonary effort is normal. No respiratory distress.     Breath sounds: Normal breath sounds. No rales.  Abdominal:     General: Abdomen is flat. Bowel sounds are normal. There is no distension.     Palpations: Abdomen is soft.     Tenderness: There is no abdominal tenderness.  Musculoskeletal:        General: No tenderness. Normal range of motion.     Cervical back: Normal range of motion and neck supple.  Skin:    General: Skin is warm and dry.  Neurological:     General: No focal deficit present.     Mental Status: She is alert and oriented to person, place, and time.       Lab Results  Component Value Date   WBC 6.3 10/02/2020   HGB 14.2 10/02/2020   HCT 44.0 10/02/2020   PLT 231 10/02/2020   GLUCOSE 92 10/02/2020   CHOL 155 10/02/2020   TRIG 83 10/02/2020   HDL 56 10/02/2020   LDLCALC 83 10/02/2020   ALT 22 10/02/2020   AST 28 10/02/2020   NA 141 10/02/2020   K 4.7 10/02/2020   CL 104 10/02/2020   CREATININE 0.98 10/02/2020   BUN 13 10/02/2020   CO2 21 10/02/2020      Assessment & Plan:   Diagnoses and all orders for this visit: Essential hypertension, benign -     CBC with Differential/Platelet -     Comprehensive metabolic panel An individual hypertension care plan was established and reinforced today.  The patient's status was assessed using clinical findings on exam and labs or diagnostic tests. The patient's success at meeting treatment goals on disease specific evidence-based guidelines and found to be well controlled. SELF MANAGEMENT: The patient and I together assessed ways to personally work towards obtaining the recommended goals. RECOMMENDATIONS: avoid decongestants found in common cold remedies, decrease consumption of  alcohol, perform routine monitoring of BP with home BP cuff, exercise, reduction of dietary salt, take medicines as prescribed, try not to miss doses and quit smoking.  Regular exercise and maintaining a healthy weight is needed.  Stress reduction may help. A CLINICAL SUMMARY including written plan identify barriers to care unique to individual due to social or financial issues.  We attempt to mutually creat solutions for individual and family understanding.  Age-related osteoporosis without current pathological fracture AN INDIVIDUAL CARE PLAN for osteoporosis was established and reinforced today.  The patient's status was assessed using clinical findings on exam, labs, and other diagnostic testing. Patient's  success at meeting treatment goals based on disease specific evidence-bassed guidelines and found to be in fair control. RECOMMENDATIONS include maintaining present medicines and treatment.  Mixed hyperlipidemia -     Lipid panel AN INDIVIDUAL CARE PLAN for hyperlipidemia/ cholesterol was established and reinforced today.  The patient's status was assessed using clinical findings on exam, lab and other diagnostic tests. The patient's disease status was assessed based on evidence-based guidelines and found to be well controlled. MEDICATIONS were reviewed. SELF MANAGEMENT GOALS have been discussed and patient's success at attaining the goal of low cholesterol was assessed. RECOMMENDATION given include regular exercise 3 days a week and low cholesterol/low fat diet. CLINICAL SUMMARY including written plan to identify barriers unique to the patient due to social or economic  reasons was discussed.  Morbid obesity ([place]) An individualize plan was formulated for obesity using patient history and physical exam to encourage weight loss.  An evidence based program was formulated.  Patient is to cut portion ize with meals and to plan physical exercise 3 days a week at least 20 minutes.  Weight watchers  and other programs are helpful.  Planned amount of weight loss 10 lbs. Other orders -     Cardiovascular Risk Assessment          I spent 30 minutes dedicated to the care of this patient on the date of this encounter to include face-to-face time with the patient, as well as: review records  Follow-up: Return in about 6 months (around 04/04/2021) for fasting.  An After Visit Summary was printed and given to the patient.  Reinaldo Meeker, MD [facility] [phone]

## 2020-10-03 LAB — CBC WITH DIFFERENTIAL/PLATELET
Basophils Absolute: 0 10*3/uL (ref 0.0–0.2)
Basos: 1 %
EOS (ABSOLUTE): 0.2 10*3/uL (ref 0.0–0.4)
Eos: 4 %
Hematocrit: 44 % (ref 34.0–46.6)
Hemoglobin: 14.2 g/dL (ref 11.1–15.9)
Immature Grans (Abs): 0 10*3/uL (ref 0.0–0.1)
Immature Granulocytes: 0 %
Lymphocytes Absolute: 2 10*3/uL (ref 0.7–3.1)
Lymphs: 31 %
MCH: 27.4 pg (ref 26.6–33.0)
MCHC: 32.3 g/dL (ref 31.5–35.7)
MCV: 85 fL (ref 79–97)
Monocytes Absolute: 0.5 10*3/uL (ref 0.1–0.9)
Monocytes: 9 %
Neutrophils Absolute: 3.5 10*3/uL (ref 1.4–7.0)
Neutrophils: 55 %
Platelets: 231 10*3/uL (ref 150–450)
RBC: 5.18 x10E6/uL (ref 3.77–5.28)
RDW: 13 % (ref 11.7–15.4)
WBC: 6.3 10*3/uL (ref 3.4–10.8)

## 2020-10-03 LAB — CARDIOVASCULAR RISK ASSESSMENT

## 2020-10-03 LAB — COMPREHENSIVE METABOLIC PANEL
ALT: 22 IU/L (ref 0–32)
AST: 28 IU/L (ref 0–40)
Albumin/Globulin Ratio: 1.7 (ref 1.2–2.2)
Albumin: 4.7 g/dL (ref 3.7–4.7)
Alkaline Phosphatase: 52 IU/L (ref 44–121)
BUN/Creatinine Ratio: 13 (ref 12–28)
BUN: 13 mg/dL (ref 8–27)
Bilirubin Total: 0.3 mg/dL (ref 0.0–1.2)
CO2: 21 mmol/L (ref 20–29)
Calcium: 9.6 mg/dL (ref 8.7–10.3)
Chloride: 104 mmol/L (ref 96–106)
Creatinine, Ser: 0.98 mg/dL (ref 0.57–1.00)
Globulin, Total: 2.8 g/dL (ref 1.5–4.5)
Glucose: 92 mg/dL (ref 65–99)
Potassium: 4.7 mmol/L (ref 3.5–5.2)
Sodium: 141 mmol/L (ref 134–144)
Total Protein: 7.5 g/dL (ref 6.0–8.5)
eGFR: 62 mL/min/{1.73_m2} (ref >59)

## 2020-10-03 LAB — LIPID PANEL
Chol/HDL Ratio: 2.8 ratio (ref 0.0–4.4)
Cholesterol, Total: 155 mg/dL (ref 100–199)
HDL: 56 mg/dL (ref >39)
LDL Chol Calc (NIH): 83 mg/dL (ref 0–99)
Triglycerides: 83 mg/dL (ref 0–149)
VLDL Cholesterol Cal: 16 mg/dL (ref 5–40)

## 2020-10-04 NOTE — Progress Notes (Signed)
Cbc normal, kidney and liver tests normal, Cholesterol normal,  lp

## 2020-12-10 DIAGNOSIS — U071 COVID-19: Secondary | ICD-10-CM | POA: Diagnosis not present

## 2021-01-13 ENCOUNTER — Encounter: Payer: Self-pay | Admitting: Legal Medicine

## 2021-01-18 ENCOUNTER — Encounter: Payer: Self-pay | Admitting: Legal Medicine

## 2021-01-20 ENCOUNTER — Other Ambulatory Visit: Payer: Self-pay | Admitting: Legal Medicine

## 2021-01-20 DIAGNOSIS — I10 Essential (primary) hypertension: Secondary | ICD-10-CM

## 2021-04-06 ENCOUNTER — Encounter: Payer: Self-pay | Admitting: Legal Medicine

## 2021-04-06 ENCOUNTER — Telehealth: Payer: Self-pay

## 2021-04-06 ENCOUNTER — Ambulatory Visit (INDEPENDENT_AMBULATORY_CARE_PROVIDER_SITE_OTHER): Payer: Medicare HMO | Admitting: Legal Medicine

## 2021-04-06 ENCOUNTER — Other Ambulatory Visit: Payer: Self-pay

## 2021-04-06 VITALS — BP 138/82 | HR 95 | Temp 98.2°F | Ht 59.0 in | Wt 188.0 lb

## 2021-04-06 DIAGNOSIS — Z6837 Body mass index (BMI) 37.0-37.9, adult: Secondary | ICD-10-CM | POA: Diagnosis not present

## 2021-04-06 DIAGNOSIS — I10 Essential (primary) hypertension: Secondary | ICD-10-CM | POA: Diagnosis not present

## 2021-04-06 DIAGNOSIS — E782 Mixed hyperlipidemia: Secondary | ICD-10-CM | POA: Diagnosis not present

## 2021-04-06 DIAGNOSIS — N951 Menopausal and female climacteric states: Secondary | ICD-10-CM

## 2021-04-06 NOTE — Telephone Encounter (Signed)
Megan Christian,  As Mrs. Blancett was checking out today, she noticed the [facility] will be here on September 26 and one on October 24. The pt is requesting for her mammogram to be scheduled on one of those days with the [facility].

## 2021-04-06 NOTE — Progress Notes (Signed)
Acute Office Visit  Subjective:    Patient ID: Megan Christian, female    DOB: [birth year], 70 y.o.   MRN: [id number]  Chief Complaint  Patient presents with   Hyperlipidemia   Hypertension    HPI Patient is in today for chronic visit  Patient presents for follow up of hypertension.  Patient tolerating lisinopril well with side effects.  Patient was diagnosed with hypertension 2010 so has been treated for hypertension for 10 years.Patient is working on maintaining diet and exercise regimen and follows up as directed. Complication include none.   Patient presents with hyperlipidemia.  Compliance with treatment has been good; patient takes medicines as directed, maintains low cholesterol diet, follows up as directed, and maintains exercise regimen.  Patient is using pravatatin without problems.     History reviewed. No pertinent past medical history.  Past Surgical History:  Procedure Laterality Date   NO PAST SURGERIES      Family History  Problem Relation Age of Onset   Emphysema Father    Diabetes Sister    Diabetes Brother     Social History   Socioeconomic History   Marital status: Married    Spouse name: Not on file   Number of children: Not on file   Years of education: Not on file   Highest education level: Not on file  Occupational History   Occupation: Retired  Tobacco Use   Smoking status: Former    Types: Cigarettes    Quit date: 2016    Years since quitting: 6.6   Smokeless tobacco: Never  Vaping Use   Vaping Use: Never used  Substance and Sexual Activity   Alcohol use: Never   Drug use: Never   Sexual activity: Not Currently  Other Topics Concern   Not on file  Social History Narrative   Not on file   Social Determinants of Health   Financial Resource Strain: Not on file  Food Insecurity: Not on file  Transportation Needs: Not on file  Physical Activity: Not on file  Stress: Not on file  Social Connections: Not on file  Intimate  Partner Violence: Not on file    Outpatient Medications Prior to Visit  Medication Sig Dispense Refill   lisinopril (ZESTRIL) 10 MG tablet TAKE 1 TABLET BY MOUTH DAILY 90 tablet 2   Multiple Vitamins-Iron (MULTIVITAMINS WITH IRON) TABS tablet Take 1 tablet by mouth daily.     pravastatin (PRAVACHOL) 40 MG tablet TAKE 1 TABLET(40 MG) BY MOUTH DAILY 90 tablet 2   No facility-administered medications prior to visit.    No Known Allergies  Review of Systems  Constitutional:  Negative for activity change and appetite change.  HENT:  Positive for tinnitus. Negative for congestion.   Eyes:  Negative for visual disturbance.  Respiratory:  Negative for chest tightness and shortness of breath.   Cardiovascular:  Negative for chest pain and palpitations.  Gastrointestinal:  Negative for abdominal distention and abdominal pain.  Endocrine: Negative for polyuria.  Genitourinary:  Negative for difficulty urinating and dysuria.  Musculoskeletal:  Negative for arthralgias and back pain.  Neurological: Negative.   Psychiatric/Behavioral: Negative.        Objective:    Physical Exam Vitals reviewed.  Constitutional:      General: She is not in acute distress.    Appearance: Normal appearance. She is obese.  HENT:     Head: Normocephalic.     Right Ear: Tympanic membrane, ear canal and external ear normal.  Left Ear: Tympanic membrane, ear canal and external ear normal.     Mouth/Throat:     Mouth: Mucous membranes are dry.     Pharynx: Oropharynx is clear.  Eyes:     Extraocular Movements: Extraocular movements intact.     Conjunctiva/sclera: Conjunctivae normal.     Pupils: Pupils are equal, round, and reactive to light.  Cardiovascular:     Rate and Rhythm: Normal rate and regular rhythm.     Pulses: Normal pulses.     Heart sounds: Normal heart sounds. No murmur heard.   No gallop.  Pulmonary:     Effort: Pulmonary effort is normal. No respiratory distress.     Breath sounds:  Normal breath sounds. No wheezing.  Abdominal:     General: Abdomen is flat. Bowel sounds are normal. There is no distension.     Palpations: Abdomen is soft.     Tenderness: There is no abdominal tenderness.  Musculoskeletal:        General: Normal range of motion.     Cervical back: Normal range of motion and neck supple.     Right lower leg: No edema.     Left lower leg: No edema.  Skin:    General: Skin is warm and dry.     Capillary Refill: Capillary refill takes less than 2 seconds.  Neurological:     General: No focal deficit present.     Mental Status: She is alert and oriented to person, place, and time. Mental status is at baseline.     Gait: Gait normal.     Deep Tendon Reflexes: Reflexes normal.  Psychiatric:        Mood and Affect: Mood normal.        Thought Content: Thought content normal.        Judgment: Judgment normal.    BP 138/82   Pulse 95   Temp 98.2 F (36.8 C)   Ht _0  (1.499 m)   Wt 188 lb (85.3 kg)   SpO2 97%   BMI 37.97 kg/m  Wt Readings from Last 3 Encounters:  04/06/21 188 lb (85.3 kg)  10/02/20 187 lb (84.8 kg)  07/02/20 180 lb (81.6 kg)    Health Maintenance Due  Topic Date Due   Hepatitis C Screening  Never done   TETANUS/TDAP  Never done   Zoster Vaccines- Shingrix (1 of 2) Never done   COLONOSCOPY (Pts 45-58yr Insurance coverage will need to be confirmed)  02/11/2020   COVID-19 Vaccine (3 - [facility]oster [facility]) 08/26/2020   INFLUENZA VACCINE  03/01/2021    There are no preventive care reminders to display for this patient.   No results found for: TSH Lab Results  Component Value Date   WBC 6.3 10/02/2020   HGB 14.2 10/02/2020   HCT 44.0 10/02/2020   MCV 85 10/02/2020   PLT 231 10/02/2020   Lab Results  Component Value Date   NA 141 10/02/2020   K 4.7 10/02/2020   CO2 21 10/02/2020   GLUCOSE 92 10/02/2020   BUN 13 10/02/2020   CREATININE 0.98 10/02/2020   BILITOT 0.3 10/02/2020   ALKPHOS 52 10/02/2020    AST 28 10/02/2020   ALT 22 10/02/2020   PROT 7.5 10/02/2020   ALBUMIN 4.7 10/02/2020   CALCIUM 9.6 10/02/2020   EGFR 62 10/02/2020   Lab Results  Component Value Date   CHOL 155 10/02/2020   Lab Results  Component Value Date   HDL 56  10/02/2020   Lab Results  Component Value Date   LDLCALC 83 10/02/2020   Lab Results  Component Value Date   TRIG 83 10/02/2020   Lab Results  Component Value Date   CHOLHDL 2.8 10/02/2020   No results found for: HGBA1C     Assessment & Plan:  Diagnoses and all orders for this visit: Mixed hyperlipidemia -     Lipid panel AN INDIVIDUAL CARE PLAN for hyperlipidemia/ cholesterol was established and reinforced today.  The patient's status was assessed using clinical findings on exam, lab and other diagnostic tests. The patient's disease status was assessed based on evidence-based guidelines and found to be well controlled. MEDICATIONS were reviewed. SELF MANAGEMENT GOALS have been discussed and patient's success at attaining the goal of low cholesterol was assessed. RECOMMENDATION given include regular exercise 3 days a week and low cholesterol/low fat diet. CLINICAL SUMMARY including written plan to identify barriers unique to the patient due to social or economic  reasons was discussed.   Essential hypertension, benign -     CBC with Differential -     Comprehensive metabolic panel An individual hypertension care plan was established and reinforced today.  The patient's status was assessed using clinical findings on exam and labs or diagnostic tests. The patient's success at meeting treatment goals on disease specific evidence-based guidelines and found to be fair controlled. SELF MANAGEMENT: The patient and I together assessed ways to personally work towards obtaining the recommended goals. RECOMMENDATIONS: avoid decongestants found in common cold remedies, decrease consumption of alcohol, perform routine monitoring of BP with home BP cuff,  exercise, reduction of dietary salt, take medicines as prescribed, try not to miss doses and quit smoking.  Regular exercise and maintaining a healthy weight is needed.  Stress reduction may help. A CLINICAL SUMMARY including written plan identify barriers to care unique to individual due to social or financial issues.  We attempt to mutually creat solutions for individual and family understanding.   Menopausal and female climacteric states -     MM Digital Screening  BMI 37.0-37.9, adult An individualize plan was formulated for obesity using patient history and physical exam to encourage weight loss.  An evidence based program was formulated.  Patient is to cut portion size with meals and to plan physical exercise 3 days a week at least 20 minutes.  Weight watchers and other programs are helpful.  Planned amount of weight loss 10 lbs. Patient meets the criteria for morbid obesity  Morbid obesity ([place])  An individualize plan was formulated for obesity using patient history and physical exam to encourage weight loss.  An evidence based program was formulated.  Patient is to cut portion size with meals and to plan physical exercise 3 days a week at least 20 minutes.  Weight watchers and other programs are helpful.  Planned amount of weight loss 10 lbs.    Orders Placed This Encounter  Procedures   MM Digital Screening   CBC with Differential   Comprehensive metabolic panel   Lipid panel     I spent 67mnutes dedicated to the care of this patient on the date of this encounter to include face-to-face time with the patient, as well as:   Follow-up: Return in about 6 months (around 10/04/2021) for fsting.  An After Visit Summary was printed and given to the patient.  LReinaldo Meeker MD [facility] ([phone]

## 2021-04-07 LAB — CBC WITH DIFFERENTIAL/PLATELET
Basophils Absolute: 0.1 10*3/uL (ref 0.0–0.2)
Basos: 1 %
EOS (ABSOLUTE): 0.2 10*3/uL (ref 0.0–0.4)
Eos: 3 %
Hematocrit: 47 % — ABNORMAL HIGH (ref 34.0–46.6)
Hemoglobin: 14.8 g/dL (ref 11.1–15.9)
Immature Grans (Abs): 0 10*3/uL (ref 0.0–0.1)
Immature Granulocytes: 0 %
Lymphocytes Absolute: 2 10*3/uL (ref 0.7–3.1)
Lymphs: 27 %
MCH: 27 pg (ref 26.6–33.0)
MCHC: 31.5 g/dL (ref 31.5–35.7)
MCV: 86 fL (ref 79–97)
Monocytes Absolute: 0.6 10*3/uL (ref 0.1–0.9)
Monocytes: 8 %
Neutrophils Absolute: 4.5 10*3/uL (ref 1.4–7.0)
Neutrophils: 61 %
Platelets: 231 10*3/uL (ref 150–450)
RBC: 5.48 x10E6/uL — ABNORMAL HIGH (ref 3.77–5.28)
RDW: 13.5 % (ref 11.7–15.4)
WBC: 7.3 10*3/uL (ref 3.4–10.8)

## 2021-04-07 LAB — COMPREHENSIVE METABOLIC PANEL
ALT: 22 IU/L (ref 0–32)
AST: 22 IU/L (ref 0–40)
Albumin/Globulin Ratio: 1.9 (ref 1.2–2.2)
Albumin: 4.8 g/dL — ABNORMAL HIGH (ref 3.7–4.7)
Alkaline Phosphatase: 50 IU/L (ref 44–121)
BUN/Creatinine Ratio: 12 (ref 12–28)
BUN: 12 mg/dL (ref 8–27)
Bilirubin Total: 0.3 mg/dL (ref 0.0–1.2)
CO2: 23 mmol/L (ref 20–29)
Calcium: 9.8 mg/dL (ref 8.7–10.3)
Chloride: 104 mmol/L (ref 96–106)
Creatinine, Ser: 1.03 mg/dL — ABNORMAL HIGH (ref 0.57–1.00)
Globulin, Total: 2.5 g/dL (ref 1.5–4.5)
Glucose: 98 mg/dL (ref 65–99)
Potassium: 4.4 mmol/L (ref 3.5–5.2)
Sodium: 142 mmol/L (ref 134–144)
Total Protein: 7.3 g/dL (ref 6.0–8.5)
eGFR: 58 mL/min/{1.73_m2} — ABNORMAL LOW (ref >59)

## 2021-04-07 LAB — LIPID PANEL
Chol/HDL Ratio: 2.7 ratio (ref 0.0–4.4)
Cholesterol, Total: 149 mg/dL (ref 100–199)
HDL: 56 mg/dL (ref >39)
LDL Chol Calc (NIH): 73 mg/dL (ref 0–99)
Triglycerides: 112 mg/dL (ref 0–149)
VLDL Cholesterol Cal: 20 mg/dL (ref 5–40)

## 2021-04-07 LAB — CARDIOVASCULAR RISK ASSESSMENT

## 2021-04-07 NOTE — Progress Notes (Signed)
Cbc normal, cholesterol normal, kidney stage 3a, liver tests normal lp

## 2021-04-10 ENCOUNTER — Other Ambulatory Visit: Payer: Self-pay | Admitting: Legal Medicine

## 2021-04-10 DIAGNOSIS — N951 Menopausal and female climacteric states: Secondary | ICD-10-CM

## 2021-04-26 ENCOUNTER — Ambulatory Visit
Admission: RE | Admit: 2021-04-26 | Discharge: 2021-04-26 | Disposition: A | Discharge status: Home / Self Care | Payer: Medicare HMO | Source: Ambulatory Visit | Attending: Legal Medicine | Admitting: Legal Medicine

## 2021-04-26 ENCOUNTER — Other Ambulatory Visit: Payer: Self-pay

## 2021-04-26 DIAGNOSIS — Z1231 Encounter for screening mammogram for malignant neoplasm of breast: Secondary | ICD-10-CM | POA: Diagnosis not present

## 2021-05-04 ENCOUNTER — Ambulatory Visit (INDEPENDENT_AMBULATORY_CARE_PROVIDER_SITE_OTHER): Payer: Medicare HMO

## 2021-05-04 ENCOUNTER — Encounter: Payer: Self-pay | Admitting: Legal Medicine

## 2021-05-04 DIAGNOSIS — Z23 Encounter for immunization: Secondary | ICD-10-CM

## 2021-05-19 DIAGNOSIS — H2513 Age-related nuclear cataract, bilateral: Secondary | ICD-10-CM | POA: Diagnosis not present

## 2021-06-17 ENCOUNTER — Other Ambulatory Visit: Payer: Self-pay | Admitting: Legal Medicine

## 2021-10-08 ENCOUNTER — Other Ambulatory Visit: Payer: Self-pay | Admitting: Legal Medicine

## 2021-10-08 DIAGNOSIS — I10 Essential (primary) hypertension: Secondary | ICD-10-CM

## 2021-10-08 NOTE — Telephone Encounter (Signed)
Refill sent to pharmacy.   

## 2021-10-10 NOTE — Progress Notes (Signed)
Subjective:  Patient ID: Megan Christian, female    DOB: 07/09/1951  Age: 71 y.o. MRN: [id number]  Chief Complaint  Patient presents with   Hypertension   Hyperlipidemia    HPI: chronic visit   Hyperlipidemia:  She takes Pravastatin 40 mg daily. Patient presents with hyperlipidemia.  Compliance with treatment has been good; patient takes medicines as directed, maintains low cholesterol diet, follows up as directed, and maintains exercise regimen.  Patient is using pravastatin without problems.   Hypertension: Patient is taking Lisinopril 10 mg daily. Patient presents for follow up of hypertension.  Patient tolerating lisinorpil well with side effects.  Patient was diagnosed with hypertension 2010 so has been treated for hypertension for 12 years.Patient is working on maintaining diet and exercise regimen and follows up as directed. Complication include none.   Osteoporosis on calcium and vitamin d Current Outpatient Medications on File Prior to Visit  Medication Sig Dispense Refill   lisinopril (ZESTRIL) 10 MG tablet TAKE 1 TABLET BY MOUTH DAILY 90 tablet 2   Multiple Vitamins-Iron (MULTIVITAMINS WITH IRON) TABS tablet Take 1 tablet by mouth daily.     pravastatin (PRAVACHOL) 40 MG tablet TAKE 1 TABLET(40 MG) BY MOUTH DAILY 90 tablet 2   No current facility-administered medications on file prior to visit.   History reviewed. No pertinent past medical history. Past Surgical History:  Procedure Laterality Date   NO PAST SURGERIES      Family History  Problem Relation Age of Onset   Emphysema Father    Diabetes Sister    Diabetes Brother    Breast cancer Neg Hx    Social History   Socioeconomic History   Marital status: Married    Spouse name: Not on file   Number of children: Not on file   Years of education: Not on file   Highest education level: Not on file  Occupational History   Occupation: Retired  Tobacco Use   Smoking status: Former    Types: Cigarettes     Quit date: 2016    Years since quitting: 7.2   Smokeless tobacco: Never  Vaping Use   Vaping Use: Never used  Substance and Sexual Activity   Alcohol use: Never   Drug use: Never   Sexual activity: Not Currently  Other Topics Concern   Not on file  Social History Narrative   Not on file   Social Determinants of Health   Financial Resource Strain: Not on file  Food Insecurity: Not on file  Transportation Needs: Not on file  Physical Activity: Not on file  Stress: Not on file  Social Connections: Not on file    Review of Systems  Constitutional:  Negative for chills, fatigue and fever.  HENT:  Positive for rhinorrhea. Negative for congestion, ear pain and sore throat.   Eyes:  Negative for visual disturbance.  Respiratory:  Negative for cough and shortness of breath.   Cardiovascular:  Negative for chest pain and palpitations.  Gastrointestinal:  Negative for abdominal pain, constipation, diarrhea, nausea and vomiting.  Endocrine: Negative for polydipsia, polyphagia and polyuria.  Genitourinary:  Negative for difficulty urinating and dysuria.  Musculoskeletal:  Negative for arthralgias, back pain and myalgias.  Skin:  Negative for rash.  Neurological:  Negative for headaches.  Psychiatric/Behavioral:  Negative for dysphoric mood. The patient is not nervous/anxious.     Objective:  BP 140/70    Pulse 93    Temp 98 F (36.7 C)    Resp 15  Ht '4\' 11"'$  (1.499 m)    Wt 189 lb (85.7 kg)    SpO2 99%    BMI 38.17 kg/m   BP/Weight 10/11/2021 4/0/8144 03/01/8562  Systolic BP 149 702 637  Diastolic BP 70 82 70  Wt. (Lbs) 189 188 187  BMI 38.17 37.97 37.77    Physical Exam Vitals reviewed.  Constitutional:      General: She is not in acute distress.    Appearance: Normal appearance.  HENT:     Head: Normocephalic.     Right Ear: Tympanic membrane normal.     Left Ear: Tympanic membrane normal.     Mouth/Throat:     Mouth: Mucous membranes are moist.     Pharynx:  Oropharynx is clear.  Eyes:     Extraocular Movements: Extraocular movements intact.     Conjunctiva/sclera: Conjunctivae normal.     Pupils: Pupils are equal, round, and reactive to light.  Cardiovascular:     Rate and Rhythm: Normal rate and regular rhythm.     Pulses: Normal pulses.     Heart sounds: Normal heart sounds. No murmur heard.   No gallop.  Pulmonary:     Effort: Pulmonary effort is normal. No respiratory distress.     Breath sounds: Normal breath sounds. No wheezing.  Abdominal:     General: Abdomen is flat. Bowel sounds are normal. There is no distension.     Tenderness: There is no abdominal tenderness.  Musculoskeletal:        General: Normal range of motion.     Cervical back: Normal range of motion and neck supple.     Right lower leg: No edema.     Left lower leg: No edema.  Skin:    General: Skin is warm and dry.     Capillary Refill: Capillary refill takes less than 2 seconds.  Neurological:     General: No focal deficit present.     Mental Status: She is alert and oriented to person, place, and time. Mental status is at baseline.     Gait: Gait normal.     Deep Tendon Reflexes: Reflexes normal.    Diabetic Foot Exam - Simple   No data filed      Lab Results  Component Value Date   WBC 7.3 04/06/2021   HGB 14.8 04/06/2021   HCT 47.0 (H) 04/06/2021   PLT 231 04/06/2021   GLUCOSE 98 04/06/2021   CHOL 149 04/06/2021   TRIG 112 04/06/2021   HDL 56 04/06/2021   LDLCALC 73 04/06/2021   ALT 22 04/06/2021   AST 22 04/06/2021   NA 142 04/06/2021   K 4.4 04/06/2021   CL 104 04/06/2021   CREATININE 1.03 (H) 04/06/2021   BUN 12 04/06/2021   CO2 23 04/06/2021      Assessment & Plan:   Problem List Items Addressed This Visit       Cardiovascular and Mediastinum   Essential hypertension, benign - Primary (Chronic)   Relevant Orders   CBC with Differential/Platelet   Comprehensive metabolic panel   AMB Referral to [place] An individual hypertension care plan was established and reinforced today.  The patient's status was assessed using clinical findings on exam and labs or diagnostic tests. The patient's success at meeting treatment goals on disease specific evidence-based guidelines and found to be well controlled. SELF MANAGEMENT: The patient and I together assessed ways to personally work towards obtaining the recommended goals. RECOMMENDATIONS: avoid decongestants found in  common cold remedies, decrease consumption of alcohol, perform routine monitoring of BP with home BP cuff, exercise, reduction of dietary salt, take medicines as prescribed, try not to miss doses and quit smoking.  Regular exercise and maintaining a healthy weight is needed.  Stress reduction may help. A CLINICAL SUMMARY including written plan identify barriers to care unique to individual due to social or financial issues.  We attempt to mutually creat solutions for individual and family understanding.      Musculoskeletal and Integument   Osteoporosis (Chronic)   Relevant Orders   VITAMIN D 25 Hydroxy (Vit-D Deficiency, Fractures)   AMB Referral to [place] Patient has osteoporosis on vitamin D and calcium     Other   Mixed hyperlipidemia   Relevant Orders   Lipid panel AN INDIVIDUAL CARE PLAN for hyperlipidemia/ cholesterol was established and reinforced today.  The patient's status was assessed using clinical findings on exam, lab and other diagnostic tests. The patient's disease status was assessed based on evidence-based guidelines and found to be fair controlled. MEDICATIONS were reviewed. SELF MANAGEMENT GOALS have been discussed and patient's success at attaining the goal of low cholesterol was assessed. RECOMMENDATION given include regular exercise 3 days a week and low cholesterol/low fat diet. CLINICAL SUMMARY including written plan to identify barriers unique to the patient due to social or  economic  reasons was discussed.    Morbid obesity ([place]) An individualize plan was formulated for obesity using patient history and physical exam to encourage weight loss.  An evidence based program was formulated.  Patient is to cut portion size with meals and to plan physical exercise 3 days a week at least 20 minutes.  Weight watchers and other programs are helpful.  Planned amount of weight loss 10 lbs.   .  30 minute visit, discussion of weight loss and review of records  Orders Placed This Encounter  Procedures   Lipid panel   CBC with Differential/Platelet   Comprehensive metabolic panel   VITAMIN D 25 Hydroxy (Vit-D Deficiency, Fractures)   AMB Referral to [facility]     Follow-up: Return in about 6 months (around 04/13/2022) for fasting.  An After Visit Summary was printed and given to the patient.  Reinaldo Meeker, MD [facility] [phone]

## 2021-10-11 ENCOUNTER — Encounter: Payer: Self-pay | Admitting: Legal Medicine

## 2021-10-11 ENCOUNTER — Ambulatory Visit (INDEPENDENT_AMBULATORY_CARE_PROVIDER_SITE_OTHER): Payer: Medicare HMO | Admitting: Legal Medicine

## 2021-10-11 ENCOUNTER — Other Ambulatory Visit: Payer: Self-pay

## 2021-10-11 VITALS — BP 140/70 | HR 93 | Temp 98.0°F | Resp 15 | Ht 59.0 in | Wt 189.0 lb

## 2021-10-11 DIAGNOSIS — I10 Essential (primary) hypertension: Secondary | ICD-10-CM

## 2021-10-11 DIAGNOSIS — M810 Age-related osteoporosis without current pathological fracture: Secondary | ICD-10-CM | POA: Diagnosis not present

## 2021-10-11 DIAGNOSIS — E782 Mixed hyperlipidemia: Secondary | ICD-10-CM

## 2021-10-12 LAB — COMPREHENSIVE METABOLIC PANEL
ALT: 21 IU/L (ref 0–32)
AST: 27 IU/L (ref 0–40)
Albumin/Globulin Ratio: 1.8 (ref 1.2–2.2)
Albumin: 4.9 g/dL — ABNORMAL HIGH (ref 3.7–4.7)
Alkaline Phosphatase: 52 IU/L (ref 44–121)
BUN/Creatinine Ratio: 13 (ref 12–28)
BUN: 14 mg/dL (ref 8–27)
Bilirubin Total: 0.3 mg/dL (ref 0.0–1.2)
CO2: 22 mmol/L (ref 20–29)
Calcium: 9.7 mg/dL (ref 8.7–10.3)
Chloride: 105 mmol/L (ref 96–106)
Creatinine, Ser: 1.04 mg/dL — ABNORMAL HIGH (ref 0.57–1.00)
Globulin, Total: 2.7 g/dL (ref 1.5–4.5)
Glucose: 94 mg/dL (ref 70–99)
Potassium: 4.9 mmol/L (ref 3.5–5.2)
Sodium: 142 mmol/L (ref 134–144)
Total Protein: 7.6 g/dL (ref 6.0–8.5)
eGFR: 57 mL/min/{1.73_m2} — ABNORMAL LOW (ref >59)

## 2021-10-12 LAB — CBC WITH DIFFERENTIAL/PLATELET
Basophils Absolute: 0.1 10*3/uL (ref 0.0–0.2)
Basos: 1 %
EOS (ABSOLUTE): 0.2 10*3/uL (ref 0.0–0.4)
Eos: 3 %
Hematocrit: 44.3 % (ref 34.0–46.6)
Hemoglobin: 14.5 g/dL (ref 11.1–15.9)
Immature Grans (Abs): 0 10*3/uL (ref 0.0–0.1)
Immature Granulocytes: 0 %
Lymphocytes Absolute: 2.1 10*3/uL (ref 0.7–3.1)
Lymphs: 31 %
MCH: 27.5 pg (ref 26.6–33.0)
MCHC: 32.7 g/dL (ref 31.5–35.7)
MCV: 84 fL (ref 79–97)
Monocytes Absolute: 0.5 10*3/uL (ref 0.1–0.9)
Monocytes: 7 %
Neutrophils Absolute: 3.9 10*3/uL (ref 1.4–7.0)
Neutrophils: 58 %
Platelets: 250 10*3/uL (ref 150–450)
RBC: 5.27 x10E6/uL (ref 3.77–5.28)
RDW: 12.7 % (ref 11.7–15.4)
WBC: 6.8 10*3/uL (ref 3.4–10.8)

## 2021-10-12 LAB — CARDIOVASCULAR RISK ASSESSMENT

## 2021-10-12 LAB — LIPID PANEL
Chol/HDL Ratio: 2.7 ratio (ref 0.0–4.4)
Cholesterol, Total: 162 mg/dL (ref 100–199)
HDL: 59 mg/dL (ref >39)
LDL Chol Calc (NIH): 84 mg/dL (ref 0–99)
Triglycerides: 104 mg/dL (ref 0–149)
VLDL Cholesterol Cal: 19 mg/dL (ref 5–40)

## 2021-10-12 LAB — VITAMIN D 25 HYDROXY (VIT D DEFICIENCY, FRACTURES): Vit D, 25-Hydroxy: 30.7 ng/mL (ref 30.0–100.0)

## 2021-10-12 NOTE — Progress Notes (Signed)
Kidney tests remain stage 3a, liver tests normal, Cholesterol normal, CBC normal, vitamin d 30.7 low normal, start vitamin D3 2000 units qd ?lp

## 2021-11-04 ENCOUNTER — Telehealth: Payer: Self-pay | Admitting: *Deleted

## 2021-11-04 NOTE — Chronic Care Management (AMB) (Signed)
?  Care Management  ? ?Note ? ?11/04/2021 ?Name: Megan Christian MRN: [id number] DOB: Jul 19, 1951 ? ?BRETTNEY FICKEN is a 71 y.o. year old female who is a primary care patient of Lillard Anes, MD. I reached out to Karl Pock by phone today offer care coordination services.  ? ?Ms. Strickland was given information about care management services today including:  ?Care management services include personalized support from designated clinical staff supervised by her physician, including individualized plan of care and coordination with other care providers ?24/7 contact phone numbers for assistance for urgent and routine care needs. ?The patient may stop care management services at any time by phone call to the office staff. ? ?Patient agreed to services and verbal consent obtained.  ? ?Follow up plan: ?Telephone appointment with care management team member scheduled for:11/11/21 ? ?Laverda Sorenson  ?Care Guide, Embedded Care Coordination ?[place]  Care Management  ?Direct Dial: [phone] ? ?

## 2021-11-04 NOTE — Chronic Care Management (AMB) (Signed)
?  Care Management  ? ?Outreach Note ? ?11/04/2021 ?Name: Megan Christian MRN: [id number] DOB: 07/29/1951 ? ?Referred by: Lillard Anes, MD ?Reason for referral : Care Coordination (Initial outreach to schedule referral with [facility] ) ? ? ?An unsuccessful telephone outreach was attempted today. The patient was referred to the case management team for assistance with care management and care coordination.  ? ?Follow Up Plan:  ?A HIPAA compliant phone message was left for the patient providing contact information and requesting a return call.  ?The care management team will reach out to the patient again over the next 14 days.  ?If patient returns call to provider office, please advise to call [place]* at [phone].* ? ?Laverda Sorenson  ?Care Guide, Embedded Care Coordination ?[place]  Care Management  ?Direct Dial: [phone] ? ?

## 2021-11-11 ENCOUNTER — Ambulatory Visit: Payer: Medicare HMO

## 2021-11-11 DIAGNOSIS — I10 Essential (primary) hypertension: Secondary | ICD-10-CM

## 2021-11-11 DIAGNOSIS — E782 Mixed hyperlipidemia: Secondary | ICD-10-CM

## 2021-11-11 NOTE — Patient Instructions (Signed)
Visit Information ? ?Thank you for taking time to visit with me today. Please don't hesitate to contact me if I can be of assistance to you before our next scheduled telephone appointment. ? ?Following are the goals we discussed today:  ?Take all medications as prescribed ?Attend all scheduled provider appointments ?Call pharmacy for medication refills 3-7 days in advance of running out of medications ?Perform all self care activities independently  ?Call provider office for new concerns or questions  ?check blood pressure 3 times per week ?choose a place to take my blood pressure (home, clinic or office, retail store) ?write blood pressure results in a log or diary ?take blood pressure log to all doctor appointments ?keep all doctor appointments ?begin an exercise program ?eat more whole grains, fruits and vegetables, lean meats and healthy fats ?limit salt intake to '2300mg'$ /day ?call for medicine refill 2 or 3 days before it runs out ?take all medications exactly as prescribed ?call doctor with any symptoms you believe are related to your medicine ?DASH Eating Plan ?DASH stands for Dietary Approaches to Stop Hypertension. The DASH eating plan is a healthy eating plan that has been shown to: ?Reduce high blood pressure (hypertension). ?Reduce your risk for type 2 diabetes, heart disease, and stroke. ?Help with weight loss. ?What are tips for following this plan? ?Reading food labels ?Check food labels for the amount of salt (sodium) per serving. Choose foods with less than 5 percent of the Daily Value of sodium. Generally, foods with less than 300 milligrams (mg) of sodium per serving fit into this eating plan. ?To find whole grains, look for the word "whole" as the first word in the ingredient list. ?Shopping ?Buy products labeled as "low-sodium" or "no salt added." ?Buy fresh foods. Avoid canned foods and pre-made or frozen meals. ?Cooking ?Avoid adding salt when cooking. Use salt-free seasonings or herbs instead  of table salt or sea salt. Check with your health care provider or pharmacist before using salt substitutes. ?Do not fry foods. Cook foods using healthy methods such as baking, boiling, grilling, roasting, and broiling instead. ?Cook with heart-healthy oils, such as olive, canola, avocado, soybean, or sunflower oil. ?Meal planning ? ?Eat a balanced diet that includes: ?4 or more servings of fruits and 4 or more servings of vegetables each day. Try to fill one-half of your plate with fruits and vegetables. ?6-8 servings of whole grains each day. ?Less than 6 oz (170 g) of lean meat, poultry, or fish each day. A 3-oz (85-g) serving of meat is about the same size as a deck of cards. One egg equals 1 oz (28 g). ?2-3 servings of low-fat dairy each day. One serving is 1 cup (237 mL). ?1 serving of nuts, seeds, or beans 5 times each week. ?2-3 servings of heart-healthy fats. Healthy fats called omega-3 fatty acids are found in foods such as walnuts, flaxseeds, fortified milks, and eggs. These fats are also found in cold-water fish, such as sardines, salmon, and mackerel. ?Limit how much you eat of: ?Canned or prepackaged foods. ?Food that is high in trans fat, such as some fried foods. ?Food that is high in saturated fat, such as fatty meat. ?Desserts and other sweets, sugary drinks, and other foods with added sugar. ?Full-fat dairy products. ?Do not salt foods before eating. ?Do not eat more than 4 egg yolks a week. ?Try to eat at least 2 vegetarian meals a week. ?Eat more home-cooked food and less restaurant, buffet, and fast food. ?Lifestyle ?When eating  at a restaurant, ask that your food be prepared with less salt or no salt, if possible. ?If you drink alcohol: ?Limit how much you use to: ?0-1 drink a day for women who are not pregnant. ?0-2 drinks a day for men. ?Be aware of how much alcohol is in your drink. In the [place] one drink equals one 12 oz bottle of beer (355 mL), one 5 oz glass of wine (148 mL), or one 1?  oz glass of hard liquor (44 mL). ?General information ?Avoid eating more than 2,300 mg of salt a day. If you have hypertension, you may need to reduce your sodium intake to 1,500 mg a day. ?Work with your health care provider to maintain a healthy body weight or to lose weight. Ask what an ideal weight is for you. ?Get at least 30 minutes of exercise that causes your heart to beat faster (aerobic exercise) most days of the week. Activities may include walking, swimming, or biking. ?Work with your health care provider or dietitian to adjust your eating plan to your individual calorie needs. ?What foods should I eat? ?Fruits ?All fresh, dried, or frozen fruit. Canned fruit in natural juice (without added sugar). ?Vegetables ?Fresh or frozen vegetables (raw, steamed, roasted, or grilled). Low-sodium or reduced-sodium tomato and vegetable juice. Low-sodium or reduced-sodium tomato sauce and tomato paste. Low-sodium or reduced-sodium canned vegetables. ?Grains ?Whole-grain or whole-wheat bread. Whole-grain or whole-wheat pasta. Brown rice. [place]. Bulgur. Whole-grain and low-sodium cereals. Pita bread. Low-fat, low-sodium crackers. Whole-wheat flour tortillas. ?Meats and other proteins ?Skinless chicken or [place]. Ground chicken or [place]. Pork with fat trimmed off. Fish and seafood. Egg whites. Dried beans, peas, or lentils. Unsalted nuts, nut butters, and seeds. Unsalted canned beans. Lean cuts of beef with fat trimmed off. Low-sodium, lean precooked or cured meat, such as sausages or meat loaves. ?Dairy ?Low-fat (1%) or fat-free (skim) milk. Reduced-fat, low-fat, or fat-free cheeses. Nonfat, low-sodium ricotta or cottage cheese. Low-fat or nonfat yogurt. Low-fat, low-sodium cheese. ?Fats and oils ?Soft margarine without trans fats. Vegetable oil. Reduced-fat, low-fat, or light mayonnaise and salad dressings (reduced-sodium). Canola, safflower, olive, avocado, soybean, and sunflower oils. Avocado. ?Seasonings  and condiments ?Herbs. Spices. Seasoning mixes without salt. ?Other foods ?Unsalted popcorn and pretzels. Fat-free sweets. ?The items listed above may not be a complete list of foods and beverages you can eat. Contact a dietitian for more information. ?What foods should I avoid? ?Fruits ?Canned fruit in a light or heavy syrup. Fried fruit. Fruit in cream or butter sauce. ?Vegetables ?Creamed or fried vegetables. Vegetables in a cheese sauce. Regular canned vegetables (not low-sodium or reduced-sodium). Regular canned tomato sauce and paste (not low-sodium or reduced-sodium). Regular tomato and vegetable juice (not low-sodium or reduced-sodium). Angie Fava. Olives. ?Grains ?Baked goods made with fat, such as croissants, muffins, or some breads. Dry pasta or rice meal packs. ?Meats and other proteins ?Fatty cuts of meat. Ribs. Fried meat. Berniece Salines. Bologna, salami, and other precooked or cured meats, such as sausages or meat loaves. Fat from the back of a pig (fatback). Bratwurst. Salted nuts and seeds. Canned beans with added salt. Canned or smoked fish. Whole eggs or egg yolks. Chicken or [place] with skin. ?Dairy ?Whole or 2% milk, cream, and half-and-half. Whole or full-fat cream cheese. Whole-fat or sweetened yogurt. Full-fat cheese. Nondairy creamers. Whipped toppings. Processed cheese and cheese spreads. ?Fats and oils ?Butter. Stick margarine. Lard. Shortening. Ghee. Bacon fat. Tropical oils, such as coconut, palm kernel, or palm oil. ?Seasonings and condiments ?  Onion salt, garlic salt, seasoned salt, table salt, and sea salt. Worcestershire sauce. Tartar sauce. Barbecue sauce. Teriyaki sauce. Soy sauce, including reduced-sodium. Steak sauce. Canned and packaged gravies. Fish sauce. Oyster sauce. Cocktail sauce. Store-bought horseradish. Ketchup. Mustard. Meat flavorings and tenderizers. Bouillon cubes. Hot sauces. Pre-made or packaged marinades. Pre-made or packaged taco seasonings. Relishes. Regular salad  dressings. ?Other foods ?Salted popcorn and pretzels. ?The items listed above may not be a complete list of foods and beverages you should avoid. Contact a dietitian for more information. ?Where to find more in

## 2021-11-11 NOTE — Chronic Care Management (AMB) (Signed)
? Care Management ?  ? RN Visit Note ? ?11/11/2021 ?Name: Megan Christian MRN: [id number] DOB: 06-28-1951 ? ?Subjective: ?Megan Christian is a 71 y.o. year old female who is a primary care patient of Lillard Anes, MD. The care management team was consulted for assistance with disease management and care coordination needs.   ? ?Engaged with patient by telephone for initial visit in response to provider referral for case management and/or care coordination services.  ? ?Consent to Services:  ? Megan Christian was given information about Care Management services today including:  ?Care Management services includes personalized support from designated clinical staff supervised by her physician, including individualized plan of care and coordination with other care providers ?24/7 contact phone numbers for assistance for urgent and routine care needs. ?The patient may stop case management services at any time by phone call to the office staff. ? ?Patient agreed to services and consent obtained.  ? ?Assessment: Review of patient past medical history, allergies, medications, health status, including review of consultants reports, laboratory and other test data, was performed as part of comprehensive evaluation and provision of chronic care management services.  ? ?SDOH (Social Determinants of Health) assessments and interventions performed:  ?SDOH Interventions   ? ?Flowsheet Row Most Recent Value  ?SDOH Interventions   ?Food Insecurity Interventions Intervention Not Indicated  ?Financial Strain Interventions Intervention Not Indicated  ?Housing Interventions Intervention Not Indicated  ?Physical Activity Interventions Other (Comments)  [reviewed insurance exercise benefit]  ?Stress Interventions Intervention Not Indicated  ?Transportation Interventions Intervention Not Indicated  ? ?  ?  ? ?Care Plan ? ?No Known Allergies ? ?Outpatient Encounter Medications as of 11/11/2021  ?Medication Sig  ? lisinopril (ZESTRIL) 10  MG tablet TAKE 1 TABLET BY MOUTH DAILY  ? Multiple Vitamins-Iron (MULTIVITAMINS WITH IRON) TABS tablet Take 1 tablet by mouth daily.  ? pravastatin (PRAVACHOL) 40 MG tablet TAKE 1 TABLET(40 MG) BY MOUTH DAILY  ? ?No facility-administered encounter medications on file as of 11/11/2021.  ? ? ?Patient Active Problem List  ? Diagnosis Date Noted  ? Mixed hyperlipidemia 07/03/2020  ? Morbid obesity ([place]) 07/03/2020  ? Essential hypertension, benign 09/03/2019  ? Osteoporosis 09/03/2019  ? ? ?Conditions to be addressed/monitored: HTN, HLD, and Obesity ? ?Care Plan : RN Care Manager Plan of Care  ?Updates made by Dimitri Ped, RN since 11/11/2021 12:00 AM  ?  ? ?Problem: Disease Management and Care Coordination Needs(HTN, HLD, obesity)   ?Priority: High  ?  ? ?Long-Range Goal: Establish Plan of Care for Care Coordination and Disease Management Needs(HTN, HLD, obesity)   ?Start Date: 11/11/2021  ?Expected End Date: 11/11/2022  ?Priority: High  ?Note:   ?Current Barriers:  ?Knowledge Deficits related to plan of care for management of HTN, HLD, and Obesity  ?No Advanced Directives in place ?States she has been checking her B/P every few days and was 124/74 this morning.  States highest  readings are around 130/80.  States she does not watch her diet closely and likes to eat cheesy popcorn every night.  States she does not eat many vegetables. States she does not exercise other than walking in stores ? ?RNCM Clinical Goal(s):  ?Patient will verbalize understanding of plan for management of HTN, HLD, and Obesity as evidenced by voiced adherence to plan of care ?verbalize basic understanding of  HTN, HLD, and Obesity disease process and self health management plan as evidenced by voiced understanding and teach back  ?take all medications exactly  as prescribed and will call provider for medication related questions as evidenced by dispense report and pt verbalization ?attend all scheduled medical appointments: Dr. Henrene Pastor as  evidenced by medical records ?demonstrate Improved adherence to prescribed treatment plan for HTN, HLD, and Obesity as evidenced by readings within limits, voiced adherence to plan of care ?continue to work with RN Care Manager to address care management and care coordination needs related to  HTN, HLD, and Obesity as evidenced by adherence to CM Team Scheduled appointments through collaboration with RN Care manager, provider, and care team.  ? ?Interventions: ?1:1 collaboration with primary care provider regarding development and update of comprehensive plan of care as evidenced by provider attestation and co-signature ?Inter-disciplinary care team collaboration (see longitudinal plan of care) ?Evaluation of current treatment plan related to  self management and patient's adherence to plan as established by provider ?Reviewed to schedule Annual Wellness Visit ? ? ?Hyperlipidemia Interventions:  (Status:  New goal.) Long Term Goal ?Medication review performed; medication list updated in electronic medical record.  ?Provider established cholesterol goals reviewed ?Counseled on importance of regular laboratory monitoring as prescribed ?Reviewed role and benefits of statin for ASCVD risk reduction ?Reviewed importance of limiting foods high in cholesterol ?Reviewed exercise goals and target of 150 minutes per week ? ?Hypertension Interventions:  (Status:  New goal.) Long Term Goal ?Last practice recorded BP readings:  ?BP Readings from Last 3 Encounters:  ?10/11/21 140/70  ?04/06/21 138/82  ?10/02/20 134/70  ?Most recent eGFR/CrCl:  ?Lab Results  ?Component Value Date  ? EGFR 57 (L) 10/11/2021  ?  No components found for: CRCL ? ?Evaluation of current treatment plan related to hypertension self management and patient's adherence to plan as established by provider ?Provided education to patient re: stroke prevention, s/s of heart attack and stroke ?Counseled on the importance of exercise goals with target of 150 minutes  per week ?Discussed plans with patient for ongoing care management follow up and provided patient with direct contact information for care management team ?Advised patient, providing education and rationale, to monitor blood pressure daily and record, calling PCP for findings outside established parameters ?Provided education on prescribed diet low sodium heart healthy ?Screening for signs and symptoms of depression related to chronic disease state  ?Assessed social determinant of health barriers ?Mailed [place] calendar and Advanced Directives forms ? ?Weight Loss Interventions:  (Status:  New goal.) Long Term Goal ?Advised patient to discuss with primary care provider options regarding weight management ?Provided verbal and/or written education to patient re: provider recommended life style modifications  ?Reviewed recommended dietary changes: avoid fad diets, make small/incremental dietary and exercise changes, eat at the table and avoid eating in front of the TV, plan management of cravings, monitor snacking and cravings in food diary ?Reviewed to try eating healthier snacks at night and limiting eating cheesy popcorn   ? ?Patient Goals/Self-Care Activities: ?Take all medications as prescribed ?Attend all scheduled provider appointments ?Call pharmacy for medication refills 3-7 days in advance of running out of medications ?Perform all self care activities independently  ?Call provider office for new concerns or questions  ?check blood pressure 3 times per week ?choose a place to take my blood pressure (home, clinic or office, retail store) ?write blood pressure results in a log or diary ?take blood pressure log to all doctor appointments ?keep all doctor appointments ?begin an exercise program ?eat more whole grains, fruits and vegetables, lean meats and healthy fats ?limit salt intake to $RemoveB'2300mg'ktlCiMuW$ /day ?call  for medicine refill 2 or 3 days before it runs out ?take all medications exactly as  prescribed ?call doctor with any symptoms you believe are related to your medicine ? ?Follow Up Plan:  Telephone follow up appointment with care management team member scheduled for:  01/11/22 ?The patient has b

## 2022-01-11 ENCOUNTER — Ambulatory Visit: Payer: Medicare HMO

## 2022-01-11 VITALS — BP 125/77

## 2022-01-11 DIAGNOSIS — I10 Essential (primary) hypertension: Secondary | ICD-10-CM

## 2022-01-11 DIAGNOSIS — E782 Mixed hyperlipidemia: Secondary | ICD-10-CM

## 2022-01-11 NOTE — Patient Instructions (Addendum)
Visit Information  Thank you for taking time to visit with me today. Please don't hesitate to contact me if I can be of assistance to you before our next scheduled telephone appointment.  Following are the goals we discussed today:  Take all medications as prescribed Attend all scheduled provider appointments Call pharmacy for medication refills 3-7 days in advance of running out of medications Perform all self care activities independently  Call provider office for new concerns or questions  check blood pressure 3 times per week choose a place to take my blood pressure (home, clinic or office, retail store) write blood pressure results in a log or diary take blood pressure log to all doctor appointments keep all doctor appointments begin an exercise program eat more whole grains, fruits and vegetables, lean meats and healthy fats limit salt intake to '2300mg'$ /day call for medicine refill 2 or 3 days before it runs out take all medications exactly as prescribed call doctor with any symptoms you believe are related to your medicine  Our next appointment is by telephone on 04/19/2022 at 0900  Please call the care guide team at [phone] if you need to cancel or reschedule your appointment.   If you are experiencing a Mental Health or [place] or need someone to talk to, please call the Suicide and Crisis Lifeline: 988 call the [place] National Suicide Prevention Lifeline: 212-684-3348 or TTY: 304-477-5562 TTY (936)756-2169) to talk to a trained counselor call 1-800-273-TALK (toll free, 24 hour hotline) call 911   Patient verbalizes understanding of instructions and care plan provided today and agrees to view in [place]. Active MyChart status and patient understanding of how to access instructions and care plan via MyChart confirmed with patient.     Telephone follow up appointment with care management team member scheduled for: 09`/19/2023  0900 Tomasa Rand RN, BSN,  CEN RN Case Manager - [employer] exhibitions officer Mobile: [phone]

## 2022-01-11 NOTE — Chronic Care Management (AMB) (Signed)
Care Management    RN Visit Note  01/11/2022 Name: Megan Christian MRN: [id number] DOB: Dec 04, 1950  Subjective: Megan Christian is a 71 y.o. year old female who is a primary care patient of Megan Anes, MD. The care management team was consulted for assistance with disease management and care coordination needs.    Engaged with patient by telephone for follow up visit in response to provider referral for case management and/or care coordination services.   Consent to Services:   Megan Christian was given information about Care Management services today including:  Care Management services includes personalized support from designated clinical staff supervised by her physician, including individualized plan of care and coordination with other care providers 24/7 contact phone numbers for assistance for urgent and routine care needs. The patient may stop case management services at any time by phone call to the office staff.  Patient agreed to services and consent obtained.   Assessment: Review of patient past medical history, allergies, medications, health status, including review of consultants reports, laboratory and other test data, was performed as part of comprehensive evaluation and provision of chronic care management services.   SDOH (Social Determinants of Health) assessments and interventions performed:    Care Plan  No Known Allergies  Outpatient Encounter Medications as of 01/11/2022  Medication Sig   lisinopril (ZESTRIL) 10 MG tablet TAKE 1 TABLET BY MOUTH DAILY   Multiple Vitamins-Iron (MULTIVITAMINS WITH IRON) TABS tablet Take 1 tablet by mouth daily.   pravastatin (PRAVACHOL) 40 MG tablet TAKE 1 TABLET(40 MG) BY MOUTH DAILY   No facility-administered encounter medications on file as of 01/11/2022.    Patient Active Problem List   Diagnosis Date Noted   Mixed hyperlipidemia 07/03/2020   Morbid obesity (Megan Christian) 07/03/2020   Essential hypertension, benign  09/03/2019   Osteoporosis 09/03/2019    Conditions to be addressed/monitored: HTN and HLD  Care Plan : RN Care Manager Plan of Care  Updates made by Megan Ates, RN since 01/11/2022 12:00 AM     Problem: Disease Management and Care Coordination Needs(HTN, HLD, obesity)   Priority: High     Long-Range Goal: Establish Plan of Care for Care Coordination and Disease Management Needs(HTN, HLD, obesity)   Start Date: 11/11/2021  Expected End Date: 11/11/2022  Priority: High  Note:   Current Barriers:  Knowledge Deficits related to plan of care for management of HTN, HLD, and Obesity  No Advanced Directives in place States she has been checking her B/P every few days and was 124/74 this morning.  States highest  readings are around 130/80.  States she does not watch her diet closely and likes to eat cheesy popcorn every night.  States she does not eat many vegetables. States she does not exercise other than walking in stores 01/11/2022 Patient reports to me that she is doing well. HTN:  Reports normal blood pressures at home. Continue to take her medications as prescribed.  Wt loss:  Reports that she is not dieting. Reports she is doing more exercise like knee bends, stretching. Reports she goes to stores 3 times a week to walk.  Denies any recent fall. Denies any pain.  Advanced Directives: Reports she did get her advanced directives in the mail and she reviewed then but has not completed the packet yet.   RNCM Clinical Goal(s):  Patient will verbalize understanding of plan for management of HTN, HLD, and Obesity as evidenced by voiced adherence to plan of care verbalize basic  understanding of  HTN, HLD, and Obesity disease process and self health management plan as evidenced by voiced understanding and teach back  take all medications exactly as prescribed and will call provider for medication related questions as evidenced by dispense report and pt verbalization attend all scheduled medical  appointments: Dr. Henrene Christian as evidenced by medical records demonstrate Improved adherence to prescribed treatment plan for HTN, HLD, and Obesity as evidenced by readings within limits, voiced adherence to plan of care continue to work with RN Care Manager to address care management and care coordination needs related to  HTN, HLD, and Obesity as evidenced by adherence to CM Team Scheduled appointments through collaboration with RN Care manager, provider, and care team.   Interventions: 1:1 collaboration with primary care provider regarding development and update of comprehensive plan of care as evidenced by provider attestation and co-signature Inter-disciplinary care team collaboration (see longitudinal plan of care) Evaluation of current treatment plan related to  self management and patient's adherence to plan as established by provider Reviewed to schedule Annual Wellness Visit   Hyperlipidemia Interventions:  (Status:  Goal on track:  Yes.) Long Term Goal Medication review performed; medication list updated in electronic medical record.  Provider established cholesterol goals reviewed Counseled on importance of regular laboratory monitoring as prescribed Reviewed role and benefits of statin for ASCVD risk reduction Reviewed importance of limiting foods high in cholesterol Reviewed exercise goals and target of 150 minutes per week Reviewed pending appointment and labs  Hypertension Interventions:  (Status:  Goal on track:  Yes.) Long Term Goal Last practice recorded BP readings:  BP Readings from Last 3 Encounters:  01/11/22 125/77  10/11/21 140/70  04/06/21 138/82  Most recent eGFR/CrCl:  Lab Results  Component Value Date   EGFR 57 (L) 10/11/2021    No components found for: "CRCL"  Evaluation of current treatment plan related to hypertension self management and patient's adherence to plan as established by provider Provided education to patient re: stroke prevention, s/s of heart  attack and stroke Counseled on the importance of exercise goals with target of 150 minutes per week Discussed plans with patient for ongoing care management follow up and provided patient with direct contact information for care management team Advised patient, providing education and rationale, to monitor blood pressure daily and record, calling PCP for findings outside established parameters Provided education on prescribed diet low sodium heart healthy Screening for signs and symptoms of depression related to chronic disease state  Assessed social determinant of health barriers Encouraged patient to complete advanced directives  Weight Loss Interventions:  (Status:  Goal on track:  Yes.) Long Term Goal Advised patient to discuss with primary care provider options regarding weight management Provided verbal and/or written education to patient re: provider recommended life style modifications  Reviewed recommended dietary changes: avoid fad diets, make small/incremental dietary and exercise changes, eat at the table and avoid eating in front of the TV, plan management of cravings, monitor snacking and cravings in food diary Reviewed to try eating healthier snacks at night and limiting eating cheesy popcorn   Encouraged patient to start her weight loss  Encouraged increasing active and exercise  Patient Goals/Self-Care Activities: Take all medications as prescribed Attend all scheduled provider appointments Call pharmacy for medication refills 3-7 days in advance of running out of medications Perform all self care activities independently  Call provider office for new concerns or questions  check blood pressure 3 times per week choose a place to take my  blood pressure (home, clinic or office, retail store) write blood pressure results in a log or diary take blood pressure log to all doctor appointments keep all doctor appointments begin an exercise program eat more whole grains, fruits  and vegetables, lean meats and healthy fats limit salt intake to 2336m/day call for medicine refill 2 or 3 days before it runs out take all medications exactly as prescribed call doctor with any symptoms you believe are related to your medicine        Plan: Telephone follow up appointment with care management team member scheduled for:  04/19/2022  0900  ATomasa RandRN, BSN, CEN RN Case Manager - [facility] [employer] exhibitions officerMobile: [phone]

## 2022-02-03 IMAGING — MG MM DIGITAL SCREENING BILAT W/ TOMO AND CAD
8 series · 8 of 24 positions shown · non-contrast
Comparison: Previous exam(s).

CLINICAL DATA: Screening.

EXAM:
DIGITAL SCREENING BILATERAL MAMMOGRAM WITH TOMOSYNTHESIS AND CAD
TECHNIQUE: Bilateral screening digital craniocaudal and mediolateral oblique
mammograms were obtained. Bilateral screening digital breast
tomosynthesis was performed. The images were evaluated with
computer-aided detection.

[R MLO synth-2D]
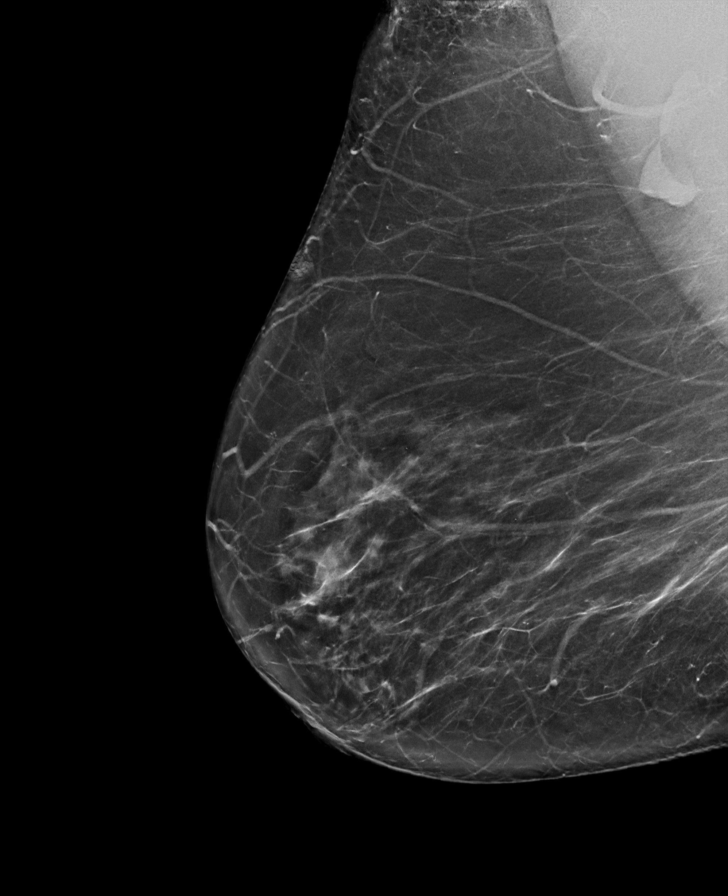

[L MLO synth-2D]
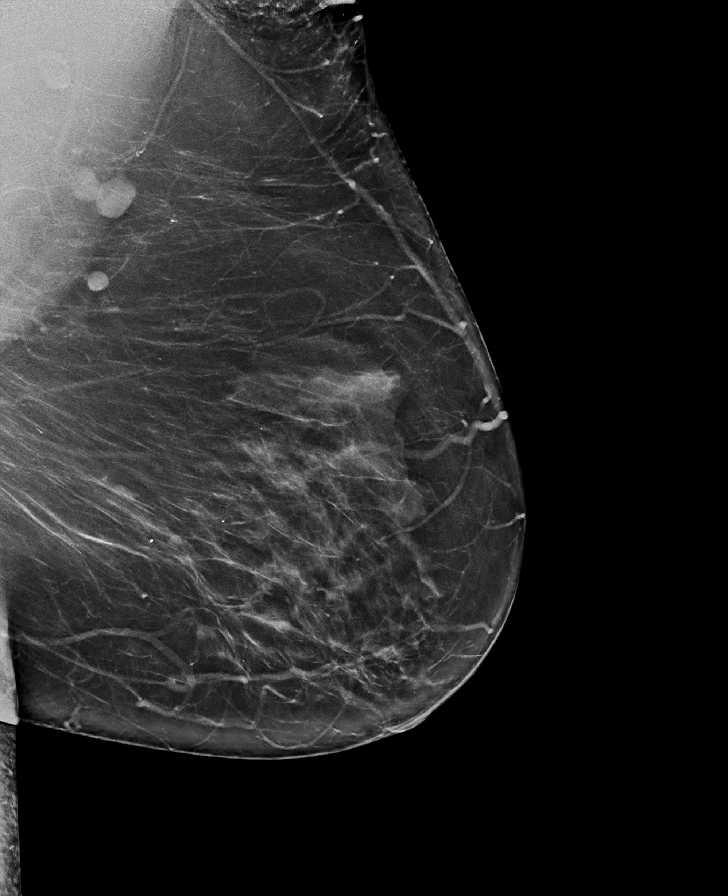

[R CC synth-2D]
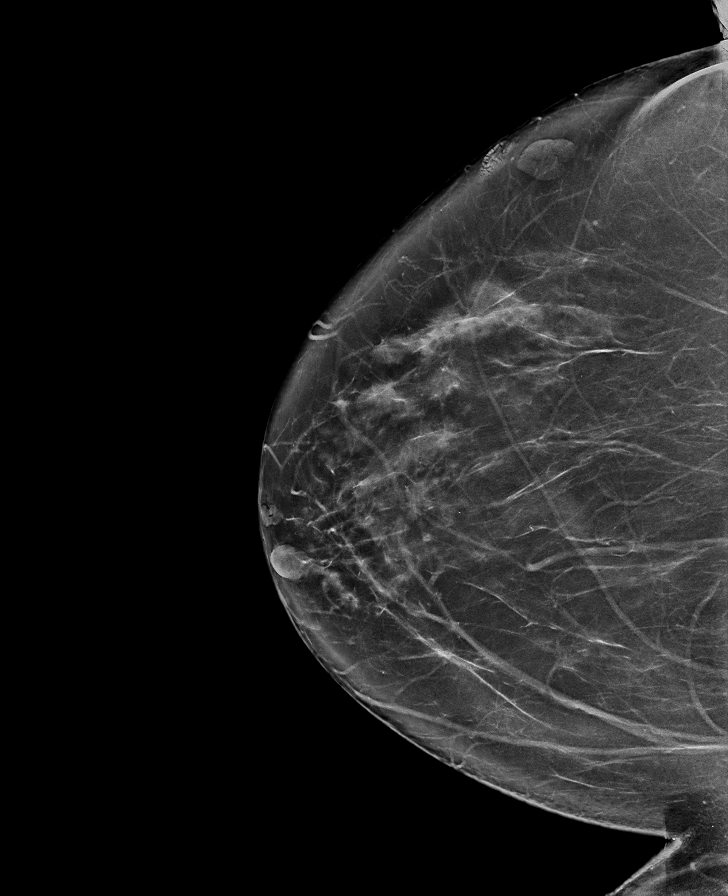

[L CC synth-2D]
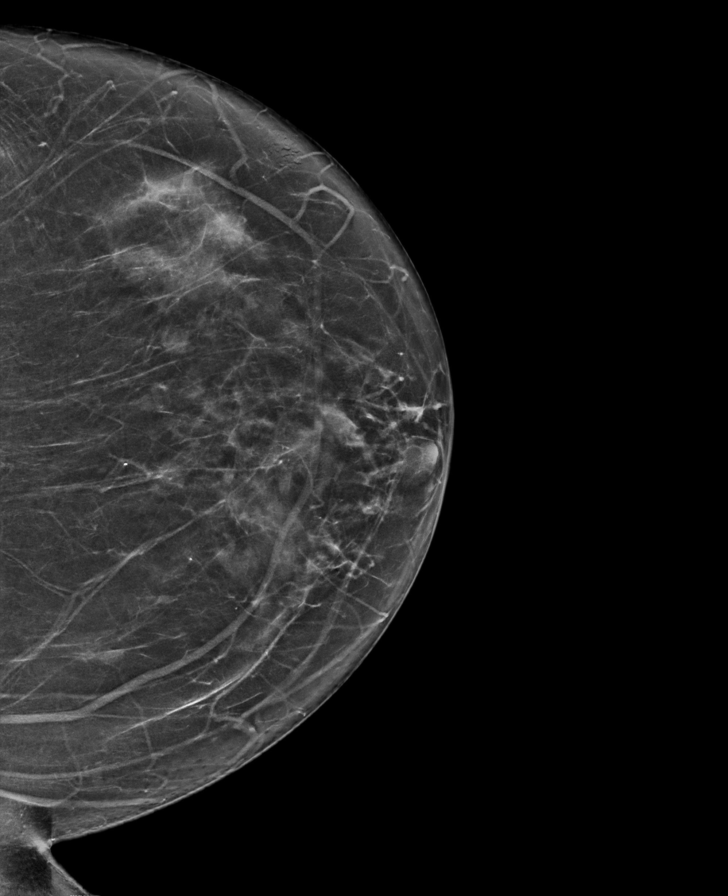

[R CC tomo · tomo slice 41/82.0]
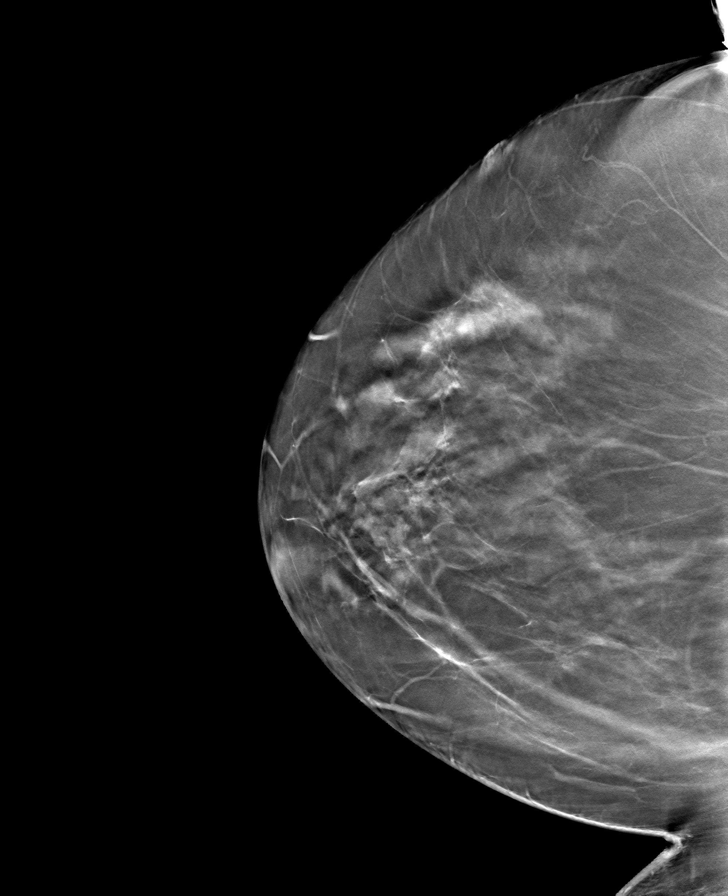

[R MLO tomo · tomo slice 43/85.0]
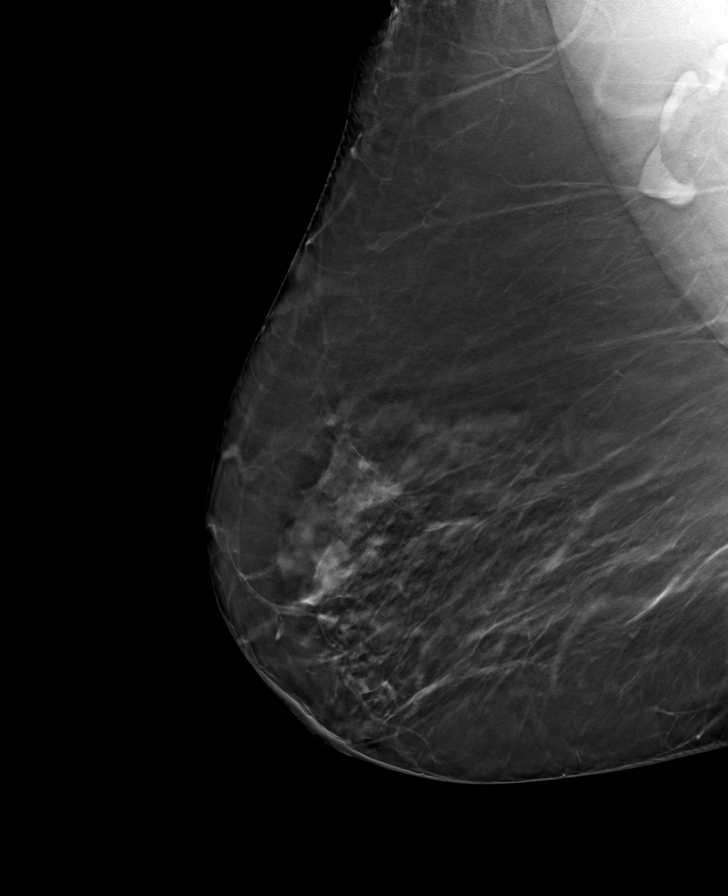

[L MLO tomo · tomo slice 43/85.0]
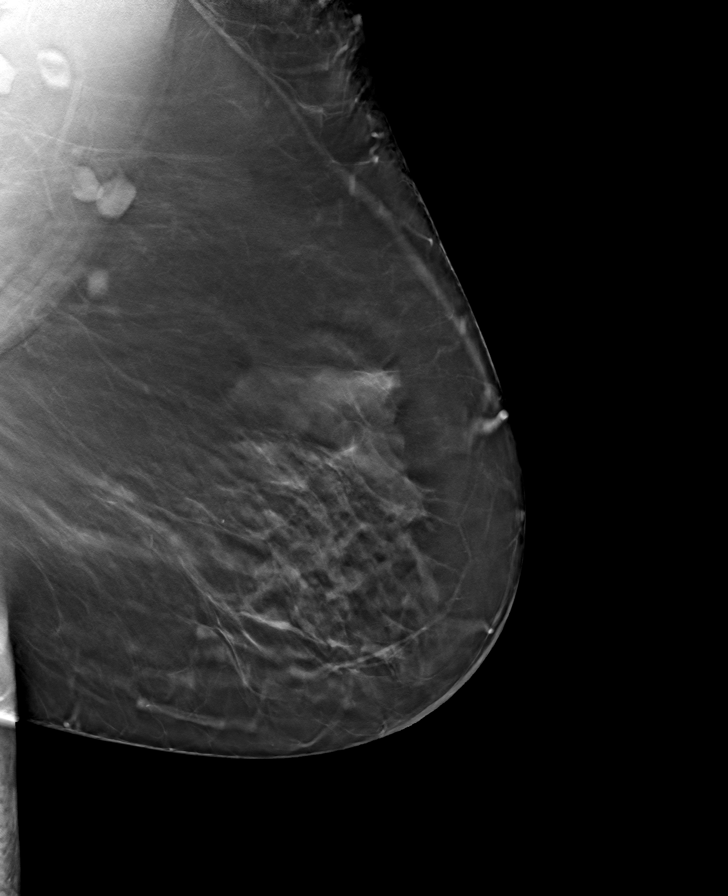

[L CC tomo · tomo slice 36/71.0]
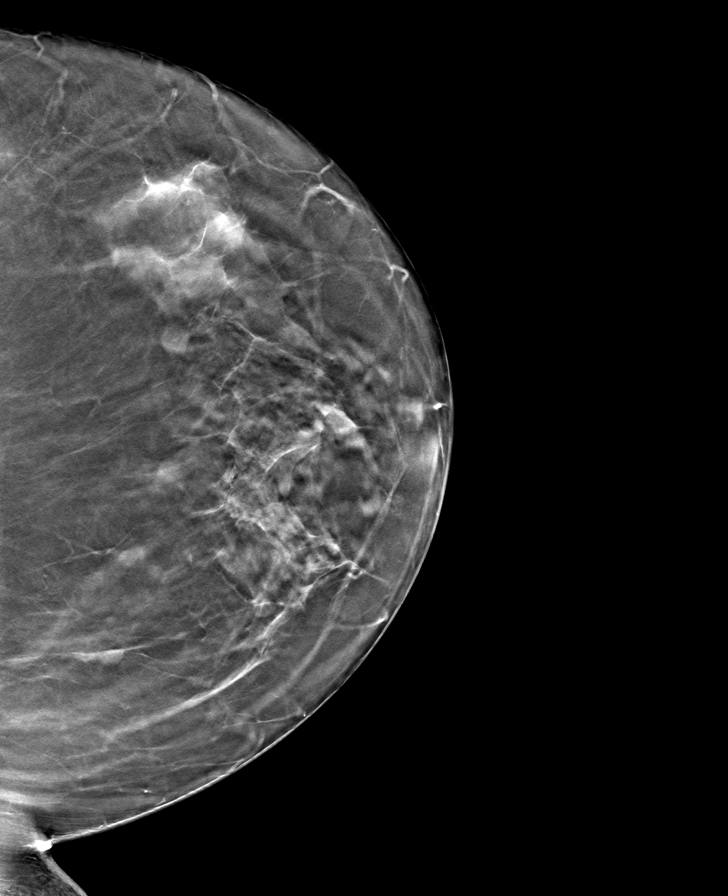

[8 of 24 positions shown; findings below may reference images not displayed]

ACR Breast Density Category c: The breast tissue is heterogeneously
dense, which may obscure small masses.
FINDINGS: There are no findings suspicious for malignancy.
IMPRESSION: No mammographic evidence of malignancy. A result letter of this
screening mammogram will be mailed directly to the patient.

RECOMMENDATION:
Screening mammogram in one year. (Code:Q3-W-BC3)

BI-RADS CATEGORY  1: Negative.

## 2022-02-08 ENCOUNTER — Ambulatory Visit: Payer: Medicare HMO | Admitting: Family Medicine

## 2022-02-08 ENCOUNTER — Encounter: Payer: Self-pay | Admitting: Family Medicine

## 2022-02-08 VITALS — BP 122/72 | HR 75 | Ht 60.0 in | Wt 189.0 lb

## 2022-02-08 DIAGNOSIS — Z0000 Encounter for general adult medical examination without abnormal findings: Secondary | ICD-10-CM | POA: Diagnosis not present

## 2022-02-08 NOTE — Patient Instructions (Signed)
Ms. Megan Christian , Thank you for taking time to come for your Medicare Wellness Visit. I appreciate your ongoing commitment to your health goals. Please review the following plan we discussed and let me know if I can assist you in the future.   Screening recommendations/referrals: Colonoscopy: up to date GI Dr said 10 years Mammogram: Up to date Bone Density: Needs repeat- continue your Vit D and Ca Recommended yearly ophthalmology/optometry visit for glaucoma screening and checkup Recommended yearly dental visit for hygiene and checkup  Vaccinations: Influenza vaccine: up to date Pneumococcal vaccine: up to date Tdap vaccine: Can get at pharmacy Shingles vaccine: Can get at pharmacy  Advanced directives: Let us know if you have questions on the paperwork  Conditions/risks identified: Falls are a big risk for [place] all. Please keep hallways and walk ways clear of items you can trip on.    Preventive Care 73 Years and Older, Female Preventive care refers to lifestyle choices and visits with your health care provider that can promote health and wellness. What does preventive care include? A yearly physical exam. This is also called an annual well check. Dental exams once or twice a year. Routine eye exams. Ask your health care provider how often you should have your eyes checked. Personal lifestyle choices, including: Daily care of your teeth and gums. Regular physical activity. Eating a healthy diet. Avoiding tobacco and drug use. Limiting alcohol use. Practicing safe sex. Taking low-dose aspirin every day. Taking vitamin and mineral supplements as recommended by your health care provider. What happens during an annual well check? The services and screenings done by your health care provider during your annual well check will depend on your age, overall health, lifestyle risk factors, and family history of disease. Counseling  Your health care provider may ask you questions about  your: Alcohol use. Tobacco use. Drug use. Emotional well-being. Home and relationship well-being. Sexual activity. Eating habits. History of falls. Memory and ability to understand (cognition). Work and work Statistician. Reproductive health. Screening  You may have the following tests or measurements: Height, weight, and BMI. Blood pressure. Lipid and cholesterol levels. These may be checked every 5 years, or more frequently if you are over 80 years old. Skin check. Lung cancer screening. You may have this screening every year starting at age 104 if you have a 30-pack-year history of smoking and currently smoke or have quit within the past 15 years. Fecal occult blood test (FOBT) of the stool. You may have this test every year starting at age 35. Flexible sigmoidoscopy or colonoscopy. You may have a sigmoidoscopy every 5 years or a colonoscopy every 10 years starting at age 84. Hepatitis C blood test. Hepatitis B blood test. Sexually transmitted disease (STD) testing. Diabetes screening. This is done by checking your blood sugar (glucose) after you have not eaten for a while (fasting). You may have this done every 1-3 years. Bone density scan. This is done to screen for osteoporosis. You may have this done starting at age 43. Mammogram. This may be done every 1-2 years. Talk to your health care provider about how often you should have regular mammograms. Talk with your health care provider about your test results, treatment options, and if necessary, the need for more tests. Vaccines  Your health care provider may recommend certain vaccines, such as: Influenza vaccine. This is recommended every year. Tetanus, diphtheria, and acellular pertussis (Tdap, Td) vaccine. You may need a Td booster every 10 years. Zoster vaccine. You may need  this after age 8. Pneumococcal 13-valent conjugate (PCV13) vaccine. One dose is recommended after age 48. Pneumococcal polysaccharide (PPSV23) vaccine.  One dose is recommended after age 71. Talk to your health care provider about which screenings and vaccines you need and how often you need them. This information is not intended to replace advice given to you by your health care provider. Make sure you discuss any questions you have with your health care provider. Document Released: 08/14/2015 Document Revised: 04/06/2016 Document Reviewed: 05/19/2015 Elsevier Interactive Patient Education  2017 [place] Prevention in the Home Falls can cause injuries. They can happen to people of all ages. There are many things you can do to make your home safe and to help prevent falls. What can I do on the outside of my home? Regularly fix the edges of walkways and driveways and fix any cracks. Remove anything that might make you trip as you walk through a door, such as a raised step or threshold. Trim any bushes or trees on the path to your home. Use bright outdoor lighting. Clear any walking paths of anything that might make someone trip, such as rocks or tools. Regularly check to see if handrails are loose or broken. Make sure that both sides of any steps have handrails. Any raised decks and porches should have guardrails on the edges. Have any leaves, snow, or ice cleared regularly. Use sand or salt on walking paths during winter. Clean up any spills in your garage right away. This includes oil or grease spills. What can I do in the bathroom? Use night lights. Install grab bars by the toilet and in the tub and shower. Do not use towel bars as grab bars. Use non-skid mats or decals in the tub or shower. If you need to sit down in the shower, use a plastic, non-slip stool. Keep the floor dry. Clean up any water that spills on the floor as soon as it happens. Remove soap buildup in the tub or shower regularly. Attach bath mats securely with double-sided non-slip rug tape. Do not have throw rugs and other things on the floor that can make  you trip. What can I do in the bedroom? Use night lights. Make sure that you have a light by your bed that is easy to reach. Do not use any sheets or blankets that are too big for your bed. They should not hang down onto the floor. Have a firm chair that has side arms. You can use this for support while you get dressed. Do not have throw rugs and other things on the floor that can make you trip. What can I do in the kitchen? Clean up any spills right away. Avoid walking on wet floors. Keep items that you use a lot in easy-to-reach places. If you need to reach something above you, use a strong step stool that has a grab bar. Keep electrical cords out of the way. Do not use floor polish or wax that makes floors slippery. If you must use wax, use non-skid floor wax. Do not have throw rugs and other things on the floor that can make you trip. What can I do with my stairs? Do not leave any items on the stairs. Make sure that there are handrails on both sides of the stairs and use them. Fix handrails that are broken or loose. Make sure that handrails are as long as the stairways. Check any carpeting to make sure that it is firmly attached to  the stairs. Fix any carpet that is loose or worn. Avoid having throw rugs at the top or bottom of the stairs. If you do have throw rugs, attach them to the floor with carpet tape. Make sure that you have a light switch at the top of the stairs and the bottom of the stairs. If you do not have them, ask someone to add them for you. What else can I do to help prevent falls? Wear shoes that: Do not have high heels. Have rubber bottoms. Are comfortable and fit you well. Are closed at the toe. Do not wear sandals. If you use a stepladder: Make sure that it is fully opened. Do not climb a closed stepladder. Make sure that both sides of the stepladder are locked into place. Ask someone to hold it for you, if possible. Clearly mark and make sure that you can  see: Any grab bars or handrails. First and last steps. Where the edge of each step is. Use tools that help you move around (mobility aids) if they are needed. These include: Canes. Walkers. Scooters. Crutches. Turn on the lights when you go into a dark area. Replace any light bulbs as soon as they burn out. Set up your furniture so you have a clear path. Avoid moving your furniture around. If any of your floors are uneven, fix them. If there are any pets around you, be aware of where they are. Review your medicines with your doctor. Some medicines can make you feel dizzy. This can increase your chance of falling. Ask your doctor what other things that you can do to help prevent falls. This information is not intended to replace advice given to you by your health care provider. Make sure you discuss any questions you have with your health care provider. Document Released: 05/14/2009 Document Revised: 12/24/2015 Document Reviewed: 08/22/2014 Elsevier Interactive Patient Education  2017 [employer].

## 2022-02-08 NOTE — Progress Notes (Signed)
I connected with  Megan Christian on 02/08/22 by a audio enabled telemedicine application and verified that I am speaking with the correct person using two identifiers.  Patient Location: Home  Provider Location: Home Office  I discussed the limitations of evaluation and management by telemedicine. The patient expressed understanding and agreed to proceed.   Subjective:   Megan Christian is a 71 y.o. female who presents for Medicare Annual (Subsequent) preventive examination.       Objective:    Today's Vitals   02/08/22 1254 02/08/22 1255  BP: 122/72   Pulse: 75   Weight: 189 lb (85.7 kg)   Height: 5' (1.524 m)   PainSc:  0-No pain   Body mass index is 36.91 kg/m.     02/08/2022   12:59 PM 11/11/2021   10:33 AM  Advanced Directives  Does Patient Have a Medical Advance Directive? No No  Would patient like information on creating a medical advance directive? No - Patient declined Yes (ED - Information included in AVS)    Current Medications (verified) Outpatient Encounter Medications as of 02/08/2022  Medication Sig   lisinopril (ZESTRIL) 10 MG tablet TAKE 1 TABLET BY MOUTH DAILY   Multiple Vitamins-Iron (MULTIVITAMINS WITH IRON) TABS tablet Take 1 tablet by mouth daily.   pravastatin (PRAVACHOL) 40 MG tablet TAKE 1 TABLET(40 MG) BY MOUTH DAILY   No facility-administered encounter medications on file as of 02/08/2022.    Allergies (verified) Patient has no known allergies.   History: History reviewed. No pertinent past medical history. Past Surgical History:  Procedure Laterality Date   NO PAST SURGERIES     Family History  Problem Relation Age of Onset   Emphysema Father    Diabetes Sister    Diabetes Brother    Breast cancer Neg Hx    Social History   Socioeconomic History   Marital status: Married    Spouse name: Not on file   Number of children: Not on file   Years of education: Not on file   Highest education level: Not on file   Occupational History   Occupation: Retired  Tobacco Use   Smoking status: Former    Types: Cigarettes    Quit date: 2016    Years since quitting: 7.5   Smokeless tobacco: Never  Vaping Use   Vaping Use: Never used  Substance and Sexual Activity   Alcohol use: Never   Drug use: Never   Sexual activity: Not Currently  Other Topics Concern   Not on file  Social History Narrative   Not on file   Social Determinants of Health   Financial Resource Strain: Low Risk  (02/08/2022)   Overall Financial Resource Strain (CARDIA)    Difficulty of Paying Living Expenses: Not hard at all  Food Insecurity: No Food Insecurity (02/08/2022)   Hunger Vital Sign    Worried About Running Out of Food in the Last Year: Never true    [place] in the Last Year: Never true  Transportation Needs: No Transportation Needs (02/08/2022)   PRAPARE - Hydrologist (Medical): No    Lack of Transportation (Non-Medical): No  Physical Activity: Inactive (02/08/2022)   Exercise Vital Sign    Days of Exercise per Week: 0 days    Minutes of Exercise per Session: 0 min  Stress: No Stress Concern Present (02/08/2022)   [place]    Feeling  of Stress : Not at all  Social Connections: Unknown (02/08/2022)   Social Connection and Isolation Panel [NHANES]    Frequency of Communication with Friends and Family: More than three times a week    Frequency of Social Gatherings with Friends and Family: More than three times a week    Attends Religious Services: Not on Advertising copywriter or Organizations: No    Attends Archivist Meetings: Never    Marital Status: Married    Tobacco Counseling Counseling given: Not Answered   Clinical Intake:  Pre-visit preparation completed: No  Pain : No/denies pain Pain Score: 0-No pain     Diabetes: No  How often do you need to have someone help you when  you read instructions, pamphlets, or other written materials from your doctor or pharmacy?: 1 - Never What is the last grade level you completed in school?: 12  Diabetic?no  Interpreter Needed?: No      Activities of Daily Living    02/08/2022    1:00 PM  In your present state of health, do you have any difficulty performing the following activities:  Hearing? 0  Vision? 0  Difficulty concentrating or making decisions? 0  Walking or climbing stairs? 0  Dressing or bathing? 0  Doing errands, shopping? 0  Preparing Food and eating ? N  Using the Toilet? N  In the past six months, have you accidently leaked urine? N  Do you have problems with loss of bowel control? N  Managing your Medications? N  Managing your Finances? N  Housekeeping or managing your Housekeeping? N    Patient Care Team: Lillard Anes, MD as PCP - General (Family Medicine) Thana Ates, RN as Case Manager  Indicate any recent Medical Services you may have received from other than [facility] providers in the past year (date may be approximate).     Assessment:   This is a routine wellness examination for Megan Christian.  Hearing/Vision screen Vision Screening - Comments:: Cataracts   Dietary issues and exercise activities discussed: Current Exercise Habits: The patient does not participate in regular exercise at present   Goals Addressed   None   Depression Screen    02/08/2022   12:57 PM 11/11/2021   10:33 AM 04/06/2021    8:52 AM 01/01/2020    9:58 AM  PHQ 2/9 Scores  PHQ - 2 Score 0 0 0 0    Fall Risk    02/08/2022    1:00 PM 11/11/2021   10:33 AM 04/06/2021    8:52 AM 07/02/2020    9:01 AM  Fall Risk   Falls in the past year? 0 0 0 0  Number falls in past yr: 0 0 0 0  Injury with Fall? 0 0 0 0  Risk for fall due to : No Fall Risks No Fall Risks    Follow up Falls evaluation completed;Education provided;Falls prevention discussed Education provided;Falls prevention discussed  Falls evaluation  completed    FALL RISK PREVENTION PERTAINING TO THE HOME:  Any stairs in or around the home? Yes  If so, are there any without handrails? Yes  Home free of loose throw rugs in walkways, pet beds, electrical cords, etc? Yes  Adequate lighting in your home to reduce risk of falls? Yes   ASSISTIVE DEVICES UTILIZED TO PREVENT FALLS:  Life alert? No  Use of a cane, walker or w/c? No  Grab bars in the bathroom? No  Shower  chair or bench in shower? No  Elevated toilet seat or a handicapped toilet? No    Cognitive Function:        02/08/2022    1:01 PM  6CIT Screen  What Year? 0 points  What month? 0 points  What time? 0 points  Count back from 20 0 points  Months in reverse 0 points  Repeat phrase 0 points  Total Score 0 points    Immunizations Immunization History  Administered Date(s) Administered   Fluad Quad(high Dose 65+) 05/03/2019, 05/04/2021   Influenza, High Dose Seasonal PF 04/30/2018, 05/14/2020   Influenza-Unspecified 04/19/2017   PFIZER(Purple Top)SARS-COV-2 Vaccination 03/05/2020, 03/26/2020   Pneumococcal Conjugate-13 04/21/2014, 04/24/2018   Pneumococcal Polysaccharide-23 04/19/2016, 04/19/2017   Zoster, Live 01/14/2015    TDAP status: Due, Education has been provided regarding the importance of this vaccine. Advised may receive this vaccine at local pharmacy or Health Dept. Aware to provide a copy of the vaccination record if obtained from local pharmacy or Health Dept. Verbalized acceptance and understanding.  Flu Vaccine status: Up to date  Pneumococcal vaccine status: Up to date  Covid-19 vaccine status: Completed vaccines  Qualifies for Shingles Vaccine? Yes   Zostavax completed Yes   Shingrix Completed?: No.    Education has been provided regarding the importance of this vaccine. Patient has been advised to call insurance company to determine out of pocket expense if they have not yet received this vaccine. Advised may also receive vaccine at  local pharmacy or Health Dept. Verbalized acceptance and understanding.  Screening Tests Health Maintenance  Topic Date Due   COVID-19 Vaccine (3 - Pfizer series) 02/24/2022 (Originally 05/21/2020)   Zoster Vaccines- Shingrix (1 of 2) 05/11/2022 (Originally 10/15/1998)   Hepatitis C Screening  10/12/2022 (Originally 10/15/1968)   COLONOSCOPY (Pts 45-71yr Insurance coverage will need to be confirmed)  02/09/2023 (Originally 02/11/2020)   TETANUS/TDAP  02/09/2023 (Originally [birth year]/1967)   INFLUENZA VACCINE  03/01/2022   MAMMOGRAM  04/27/2023   Pneumonia Vaccine 73 Years old  Completed   DEXA SCAN  Completed   HPV VACCINES  Aged Out    Health Maintenance  There are no preventive care reminders to display for this patient.   Colorectal cancer screening: Type of screening: Colonoscopy. Completed 2016. Repeat every 5 years  Mammogram status: Completed 04/2022. Repeat every year  Bone Density status: Completed 2018. Results reflect: Bone density results: OSTEOPENIA. Repeat every 2 years.  Lung Cancer Screening: (Low Dose CT Chest recommended if Age 73-80years, 30 pack-year currently smoking OR have quit w/in 15years.) does not qualify.   Lung Cancer Screening Referral: n/a  Additional Screening:  Hepatitis C Screening: does not qualify; Completed   Vision Screening: Recommended annual ophthalmology exams for early detection of glaucoma and other disorders of the eye. Is the patient up to date with their annual eye exam?  Yes  Who is the provider or what is the name of the office in which the patient attends annual eye exams? Dr CHulan Saas If pt is not established with a provider, would they like to be referred to a provider to establish care? na.   Dental Screening: Recommended annual dental exams for proper oral hygiene  Community Resource Referral / Chronic Care Management: CRR required this visit?  No   CCM required this visit?  No      Plan:     I have personally  reviewed and noted the following in the patient's chart:   Medical and social history Use  of alcohol, tobacco or illicit drugs  Current medications and supplements including opioid prescriptions.  Functional ability and status Nutritional status Physical activity Advanced directives List of other physicians Hospitalizations, surgeries, and ER visits in previous 12 months Vitals Screenings to include cognitive, depression, and falls Referrals and appointments  In addition, I have reviewed and discussed with patient certain preventive protocols, quality metrics, and best practice recommendations. A written personalized care plan for preventive services as well as general preventive health recommendations were provided to patient.     Perlie Mayo, NP   02/08/2022

## 2022-02-10 ENCOUNTER — Ambulatory Visit: Payer: Medicare HMO

## 2022-02-10 VITALS — BP 120/70

## 2022-02-10 DIAGNOSIS — I10 Essential (primary) hypertension: Secondary | ICD-10-CM

## 2022-02-10 DIAGNOSIS — E782 Mixed hyperlipidemia: Secondary | ICD-10-CM

## 2022-02-10 NOTE — Patient Instructions (Signed)
Visit Information  Thank you for taking time to visit with me today. Please don't hesitate to contact me if I can be of assistance to you before our next scheduled telephone appointment.  Following are the goals we discussed today:  Follow up with MD as planned. Let MD office know if you need a case manger in the future.   Please call the care guide team at [phone] if you need to cancel or reschedule your appointment.   If you are experiencing a Mental Health or [place] or need someone to talk to, please call the Suicide and Crisis Lifeline: 988 call the [place] National Suicide Prevention Lifeline: (601)685-6204 or TTY: 612-694-5348 TTY 548 144 9303) to talk to a trained counselor call 1-800-273-TALK (toll free, 24 hour hotline) call 911   The patient verbalized understanding of instructions, educational materials, and care plan provided today and DECLINED offer to receive copy of patient instructions, educational materials, and care plan.     Tomasa Rand RN, BSN, CEN RN Case Freight forwarder - [employer]/gallery exhibitions officer Mobile: [phone]

## 2022-02-10 NOTE — Chronic Care Management (AMB) (Signed)
Chronic Care Management   CCM RN Visit Note  02/10/2022 Name: Megan Christian MRN: [id number] DOB: [birth year]  Subjective: Megan Christian is a 71 y.o. year old female who is a primary care patient of Lillard Anes, MD. The care management team was consulted for assistance with disease management and care coordination needs.    Engaged with patient by telephone for follow up visit in response to provider referral for case management and/or care coordination services.   Consent to Services:  The patient was given information about Chronic Care Management services, agreed to services, and gave verbal consent prior to initiation of services.  Please see initial visit note for detailed documentation.   Patient agreed to services and verbal consent obtained.   Assessment: Review of patient past medical history, allergies, medications, health status, including review of consultants reports, laboratory and other test data, was performed as part of comprehensive evaluation and provision of chronic care management services.   SDOH (Social Determinants of Health) assessments and interventions performed:    CCM Care Plan  No Known Allergies  Outpatient Encounter Medications as of 02/10/2022  Medication Sig   lisinopril (ZESTRIL) 10 MG tablet TAKE 1 TABLET BY MOUTH DAILY   Multiple Vitamins-Iron (MULTIVITAMINS WITH IRON) TABS tablet Take 1 tablet by mouth daily.   pravastatin (PRAVACHOL) 40 MG tablet TAKE 1 TABLET(40 MG) BY MOUTH DAILY   No facility-administered encounter medications on file as of 02/10/2022.    Patient Active Problem List   Diagnosis Date Noted   Mixed hyperlipidemia 07/03/2020   Morbid obesity ([place]) 07/03/2020   Essential hypertension, benign 09/03/2019   Osteoporosis 09/03/2019    Conditions to be addressed/monitored:HTN and HLD  Care Plan : RN Care Manager Plan of Care  Updates made by Thana Ates, RN since 02/10/2022 12:00 AM     Problem: Disease  Management and Care Coordination Needs(HTN, HLD, obesity)   Priority: High     Long-Range Goal: Establish Plan of Care for Care Coordination and Disease Management Needs(HTN, HLD, obesity)   Start Date: 11/11/2021  Expected End Date: 11/11/2022  Priority: High  Note:   Current Barriers:  Knowledge Deficits related to plan of care for management of HTN, HLD, and Obesity  No Advanced Directives in place States she has been checking her B/P every few days and was 124/74 this morning.  States highest  readings are around 130/80.  States she does not watch her diet closely and likes to eat cheesy popcorn every night.  States she does not eat many vegetables. States she does not exercise other than walking in stores 01/11/2022 Patient reports to me that she is doing well. HTN:  Reports normal blood pressures at home. Continue to take her medications as prescribed.  Wt loss:  Reports that she is not dieting. Reports she is doing more exercise like knee bends, stretching. Reports she goes to stores 3 times a week to walk.  Denies any recent fall. Denies any pain.  Advanced Directives: Reports she did get her advanced directives in the mail and she reviewed then but has not completed the packet yet. 02/10/2022 Spoke with patient today and she reports that she is doing well. Reports no pain. Denies any medication concerns.  Reports she continues to exercise 3 times per week.  Reports she is self monitoring her BP every other day. Reports today's reading of 120/70.  Patient denies need for additional calls. Reports she is self managing well. RNCM Clinical Goal(s):  Patient  will verbalize understanding of plan for management of HTN, HLD, and Obesity as evidenced by voiced adherence to plan of care verbalize basic understanding of  HTN, HLD, and Obesity disease process and self health management plan as evidenced by voiced understanding and teach back  take all medications exactly as prescribed and will call  provider for medication related questions as evidenced by dispense report and pt verbalization attend all scheduled medical appointments: Dr. Henrene Pastor as evidenced by medical records demonstrate Improved adherence to prescribed treatment plan for HTN, HLD, and Obesity as evidenced by readings within limits, voiced adherence to plan of care continue to work with RN Care Manager to address care management and care coordination needs related to  HTN, HLD, and Obesity as evidenced by adherence to CM Team Scheduled appointments through collaboration with RN Care manager, provider, and care team.   Interventions: 1:1 collaboration with primary care provider regarding development and update of comprehensive plan of care as evidenced by provider attestation and co-signature Inter-disciplinary care team collaboration (see longitudinal plan of care) Evaluation of current treatment plan related to  self management and patient's adherence to plan as established by provider Reviewed to schedule Annual Wellness Visit   Hyperlipidemia Interventions:  (Status:  Goal Met.) Long Term Goal .lab Medication review performed; medication list updated in electronic medical record.  Provider established cholesterol goals reviewed Counseled on importance of regular laboratory monitoring as prescribed Reviewed role and benefits of statin for ASCVD risk reduction Reviewed importance of limiting foods high in cholesterol Reviewed exercise goals and target of 150 minutes per week   Hypertension Interventions:  (Status:  Goal Met.) Long Term Goal Last practice recorded BP readings:  BP Readings from Last 3 Encounters:  02/10/22 120/70  02/08/22 122/72  01/11/22 125/77  Most recent eGFR/CrCl:  Lab Results  Component Value Date   EGFR 57 (L) 10/11/2021      Evaluation of current treatment plan related to hypertension self management and patient's adherence to plan as established by provider Provided education to  patient re: stroke prevention, s/s of heart attack and stroke Counseled on the importance of exercise goals with target of 150 minutes per week Discussed plans with patient for ongoing care management follow up and provided patient with direct contact information for care management team Advised patient, providing education and rationale, to monitor blood pressure daily and record, calling PCP for findings outside established parameters Provided education on prescribed diet low sodium heart healthy Screening for signs and symptoms of depression related to chronic disease state  Assessed social determinant of health barriers Encouraged patient to complete advanced directives  Weight Loss Interventions:  (Status:  Goal Met.) Long Term Goal Advised patient to discuss with primary care provider options regarding weight management Provided verbal and/or written education to patient re: provider recommended life style modifications  Reviewed recommended dietary changes: avoid fad diets, make small/incremental dietary and exercise changes, eat at the table and avoid eating in front of the TV, plan management of cravings, monitor snacking and cravings in food diary Reviewed to try eating healthier snacks at night and limiting eating cheesy popcorn   Encouraged patient to start her weight loss  Encouraged increasing active and exercise  Patient Goals/Self-Care Activities: Take all medications as prescribed Attend all scheduled provider appointments Call pharmacy for medication refills 3-7 days in advance of running out of medications Perform all self care activities independently  Call provider office for new concerns or questions  check blood pressure 3 times per  week choose a place to take my blood pressure (home, clinic or office, retail store) write blood pressure results in a log or diary take blood pressure log to all doctor appointments keep all doctor appointments begin an exercise  program eat more whole grains, fruits and vegetables, lean meats and healthy fats limit salt intake to $RemoveB'2300mg'BpRSoBtT$ /day call for medicine refill 2 or 3 days before it runs out take all medications exactly as prescribed call doctor with any symptoms you believe are related to your medicine        Plan:No further follow up required: with case manager. Continue to follow up with MD as planned .acs

## 2022-03-06 ENCOUNTER — Other Ambulatory Visit: Payer: Self-pay | Admitting: Legal Medicine

## 2022-04-14 ENCOUNTER — Encounter: Payer: Self-pay | Admitting: Physician Assistant

## 2022-04-14 ENCOUNTER — Ambulatory Visit (INDEPENDENT_AMBULATORY_CARE_PROVIDER_SITE_OTHER): Payer: Medicare HMO | Admitting: Physician Assistant

## 2022-04-14 VITALS — BP 122/82 | HR 86 | Temp 97.5°F | Ht 59.0 in | Wt 184.0 lb

## 2022-04-14 DIAGNOSIS — E782 Mixed hyperlipidemia: Secondary | ICD-10-CM | POA: Diagnosis not present

## 2022-04-14 DIAGNOSIS — E559 Vitamin D deficiency, unspecified: Secondary | ICD-10-CM

## 2022-04-14 DIAGNOSIS — I10 Essential (primary) hypertension: Secondary | ICD-10-CM

## 2022-04-14 DIAGNOSIS — Z1211 Encounter for screening for malignant neoplasm of colon: Secondary | ICD-10-CM | POA: Diagnosis not present

## 2022-04-14 DIAGNOSIS — Z1231 Encounter for screening mammogram for malignant neoplasm of breast: Secondary | ICD-10-CM

## 2022-04-14 MED ORDER — LISINOPRIL 10 MG PO TABS: 10.0000 mg | ORAL_TABLET | Freq: Every day | ORAL | 1 refills | Status: DC

## 2022-04-14 NOTE — Progress Notes (Unsigned)
Subjective:  Patient ID: Megan Christian, female    DOB: [birth year]  Age: 71 y.o. MRN: [id number]  Chief Complaint  Patient presents with   Hypertension   Hyperlipidemia    HPI  Pt presents for follow up of hypertension. The patient is tolerating the medication well without side effects. Compliance with treatment has been good; including taking medication as directed , maintains a healthy diet and regular exercise regimen , and following up as directed. She is currently taking zestril '10mg'$  qd  Mixed hyperlipidemia  Pt presents with hyperlipidemia. Compliance with treatment has been good The patient is compliant with medications, maintains a low cholesterol diet , follows up as directed , and maintains an exercise regimen . The patient denies experiencing any hypercholesterolemia related symptoms. She is taking pravachol '40mg'$  qd  Pt with history of vitamin D def - currently on multivitamin with D supplement daily  Pt would like to schedule mammogram   Current Outpatient Medications on File Prior to Visit  Medication Sig Dispense Refill   Multiple Vitamins-Iron (MULTIVITAMINS WITH IRON) TABS tablet Take 1 tablet by mouth daily.     pravastatin (PRAVACHOL) 40 MG tablet TAKE 1 TABLET(40 MG) BY MOUTH DAILY 90 tablet 1   No current facility-administered medications on file prior to visit.   History reviewed. No pertinent past medical history. Past Surgical History:  Procedure Laterality Date   NO PAST SURGERIES      Family History  Problem Relation Age of Onset   Emphysema Father    Diabetes Sister    Diabetes Brother    Breast cancer Neg Hx    Social History   Socioeconomic History   Marital status: Married    Spouse name: Not on file   Number of children: Not on file   Years of education: Not on file   Highest education level: Not on file  Occupational History   Occupation: Retired  Tobacco Use   Smoking status: Former    Types: Cigarettes    Quit date: 2016     Years since quitting: 7.7   Smokeless tobacco: Never  Vaping Use   Vaping Use: Never used  Substance and Sexual Activity   Alcohol use: Never   Drug use: Never   Sexual activity: Not Currently  Other Topics Concern   Not on file  Social History Narrative   Not on file   Social Determinants of Health   Financial Resource Strain: Low Risk  (02/08/2022)   Overall Financial Resource Strain (CARDIA)    Difficulty of Paying Living Expenses: Not hard at all  Food Insecurity: No Food Insecurity (02/08/2022)   Hunger Vital Sign    Worried About Running Out of Food in the Last Year: Never true    [place] in the Last Year: Never true  Transportation Needs: No Transportation Needs (02/08/2022)   PRAPARE - Hydrologist (Medical): No    Lack of Transportation (Non-Medical): No  Physical Activity: Inactive (02/08/2022)   Exercise Vital Sign    Days of Exercise per Week: 0 days    Minutes of Exercise per Session: 0 min  Stress: No Stress Concern Present (02/08/2022)   [place]    Feeling of Stress : Not at all  Social Connections: Unknown (02/08/2022)   Social Connection and Isolation Panel [NHANES]    Frequency of Communication with Friends and Family: More than three times a week  Frequency of Social Gatherings with Friends and Family: More than three times a week    Attends Religious Services: Not on file    Active Member of Clubs or Organizations: No    Attends Archivist Meetings: Never    Marital Status: Married    Review of Systems CONSTITUTIONAL: Negative for chills, fatigue, fever, unintentional weight gain and unintentional weight loss.  E/N/T: Negative for ear pain, nasal congestion and sore throat.  CARDIOVASCULAR: Negative for chest pain, dizziness, palpitations and pedal edema.  RESPIRATORY: Negative for recent cough and dyspnea.  GASTROINTESTINAL: Negative for  abdominal pain, acid reflux symptoms, constipation, diarrhea, nausea and vomiting.  MSK: Negative for arthralgias and myalgias.  INTEGUMENTARY: Negative for rash.  NEUROLOGICAL: Negative for dizziness and headaches.  PSYCHIATRIC: Negative for sleep disturbance and to question depression screen.  Negative for depression, negative for anhedonia.       Objective:  PHYSICAL EXAM:   VS: BP 122/82 (BP Location: Left Arm, Patient Position: Sitting)   Pulse 86   Temp (!) 97.5 F (36.4 C) (Temporal)   Ht '4\' 11"'$  (1.499 m)   Wt 184 lb (83.5 kg)   SpO2 96%   BMI 37.16 kg/m   GEN: Well nourished, well developed, in no acute distress   Cardiac: RRR; no murmurs, rubs, or gallops,no edema -  Respiratory:  normal respiratory rate and pattern with no distress - normal breath sounds with no rales, rhonchi, wheezes or rubs  MS: no deformity or atrophy  Skin: warm and dry, no rash   Psych: euthymic mood, appropriate affect and demeanor  Lab Results  Component Value Date   WBC 6.9 04/14/2022   HGB 14.5 04/14/2022   HCT 43.9 04/14/2022   PLT 211 04/14/2022   GLUCOSE 94 04/14/2022   CHOL 149 04/14/2022   TRIG 94 04/14/2022   HDL 56 04/14/2022   LDLCALC 76 04/14/2022   ALT 25 04/14/2022   AST 23 04/14/2022   NA 144 04/14/2022   K 4.6 04/14/2022   CL 106 04/14/2022   CREATININE 0.90 04/14/2022   BUN 17 04/14/2022   CO2 21 04/14/2022   TSH 1.800 04/14/2022      Assessment & Plan:   Problem List Items Addressed This Visit       Cardiovascular and Mediastinum   Essential hypertension, benign (Chronic)   Relevant Medications   lisinopril (ZESTRIL) 10 MG tablet   Other Relevant Orders   CBC with Differential/Platelet   Comprehensive metabolic panel   TSH Continue current meds     Other   Mixed hyperlipidemia - Primary   Relevant Medications   lisinopril (ZESTRIL) 10 MG tablet   Other Relevant Orders   Lipid panel Continue pravachol and watch diet   Other Visit Diagnoses      Vitamin D deficiency       Relevant Orders   VITAMIN D 25 Hydroxy (Vit-D Deficiency, Fractures)   Encounter for screening mammogram for breast cancer       Relevant Orders   MM DIGITAL SCREENING BILATERAL     .  Meds ordered this encounter  Medications   lisinopril (ZESTRIL) 10 MG tablet    Sig: Take 1 tablet (10 mg total) by mouth daily.    Dispense:  90 tablet    Refill:  1    Order Specific Question:   Supervising Provider    AnswerShelton Silvas    Orders Placed This Encounter  Procedures   MM DIGITAL SCREENING  BILATERAL   CBC with Differential/Platelet   Comprehensive metabolic panel   TSH   Lipid panel   VITAMIN D 25 Hydroxy (Vit-D Deficiency, Fractures)   Cardiovascular Risk Assessment   Ambulatory referral to Gastroenterology     Follow-up: Return in about 6 months (around 10/13/2022) for chronic fasting follow up.  An After Visit Summary was printed and given to the patient.  Yetta Flock [facility] [phone]

## 2022-04-15 ENCOUNTER — Telehealth: Payer: Self-pay

## 2022-04-15 LAB — COMPREHENSIVE METABOLIC PANEL
ALT: 25 IU/L (ref 0–32)
AST: 23 IU/L (ref 0–40)
Albumin/Globulin Ratio: 2.3 — ABNORMAL HIGH (ref 1.2–2.2)
Albumin: 5 g/dL — ABNORMAL HIGH (ref 3.8–4.8)
Alkaline Phosphatase: 47 IU/L (ref 44–121)
BUN/Creatinine Ratio: 19 (ref 12–28)
BUN: 17 mg/dL (ref 8–27)
Bilirubin Total: 0.3 mg/dL (ref 0.0–1.2)
CO2: 21 mmol/L (ref 20–29)
Calcium: 9.8 mg/dL (ref 8.7–10.3)
Chloride: 106 mmol/L (ref 96–106)
Creatinine, Ser: 0.9 mg/dL (ref 0.57–1.00)
Globulin, Total: 2.2 g/dL (ref 1.5–4.5)
Glucose: 94 mg/dL (ref 70–99)
Potassium: 4.6 mmol/L (ref 3.5–5.2)
Sodium: 144 mmol/L (ref 134–144)
Total Protein: 7.2 g/dL (ref 6.0–8.5)
eGFR: 68 mL/min/{1.73_m2} (ref >59)

## 2022-04-15 LAB — LIPID PANEL
Chol/HDL Ratio: 2.7 ratio (ref 0.0–4.4)
Cholesterol, Total: 149 mg/dL (ref 100–199)
HDL: 56 mg/dL (ref >39)
LDL Chol Calc (NIH): 76 mg/dL (ref 0–99)
Triglycerides: 94 mg/dL (ref 0–149)
VLDL Cholesterol Cal: 17 mg/dL (ref 5–40)

## 2022-04-15 LAB — CBC WITH DIFFERENTIAL/PLATELET
Basophils Absolute: 0.1 10*3/uL (ref 0.0–0.2)
Basos: 1 %
EOS (ABSOLUTE): 0.2 10*3/uL (ref 0.0–0.4)
Eos: 4 %
Hematocrit: 43.9 % (ref 34.0–46.6)
Hemoglobin: 14.5 g/dL (ref 11.1–15.9)
Immature Grans (Abs): 0 10*3/uL (ref 0.0–0.1)
Immature Granulocytes: 0 %
Lymphocytes Absolute: 2 10*3/uL (ref 0.7–3.1)
Lymphs: 28 %
MCH: 28.5 pg (ref 26.6–33.0)
MCHC: 33 g/dL (ref 31.5–35.7)
MCV: 86 fL (ref 79–97)
Monocytes Absolute: 0.6 10*3/uL (ref 0.1–0.9)
Monocytes: 8 %
Neutrophils Absolute: 4.1 10*3/uL (ref 1.4–7.0)
Neutrophils: 59 %
Platelets: 211 10*3/uL (ref 150–450)
RBC: 5.09 x10E6/uL (ref 3.77–5.28)
RDW: 13 % (ref 11.7–15.4)
WBC: 6.9 10*3/uL (ref 3.4–10.8)

## 2022-04-15 LAB — TSH: TSH: 1.8 u[IU]/mL (ref 0.450–4.500)

## 2022-04-15 LAB — CARDIOVASCULAR RISK ASSESSMENT

## 2022-04-15 LAB — VITAMIN D 25 HYDROXY (VIT D DEFICIENCY, FRACTURES): Vit D, 25-Hydroxy: 32.5 ng/mL (ref 30.0–100.0)

## 2022-04-15 NOTE — Telephone Encounter (Signed)
Patient made aware labs.

## 2022-04-19 ENCOUNTER — Telehealth: Payer: Medicare HMO

## 2022-04-27 ENCOUNTER — Ambulatory Visit
Admission: RE | Admit: 2022-04-27 | Discharge: 2022-04-27 | Disposition: A | Discharge status: Home / Self Care | Payer: Medicare HMO | Source: Ambulatory Visit | Attending: Legal Medicine | Admitting: Legal Medicine

## 2022-04-27 ENCOUNTER — Other Ambulatory Visit: Payer: Self-pay | Admitting: Physician Assistant

## 2022-04-27 DIAGNOSIS — Z1231 Encounter for screening mammogram for malignant neoplasm of breast: Secondary | ICD-10-CM

## 2022-05-11 ENCOUNTER — Ambulatory Visit (INDEPENDENT_AMBULATORY_CARE_PROVIDER_SITE_OTHER): Payer: Medicare HMO

## 2022-05-11 DIAGNOSIS — Z23 Encounter for immunization: Secondary | ICD-10-CM | POA: Diagnosis not present

## 2022-06-01 DIAGNOSIS — H2513 Age-related nuclear cataract, bilateral: Secondary | ICD-10-CM | POA: Diagnosis not present

## 2022-06-28 ENCOUNTER — Other Ambulatory Visit: Payer: Self-pay | Admitting: Legal Medicine

## 2022-06-28 DIAGNOSIS — I10 Essential (primary) hypertension: Secondary | ICD-10-CM

## 2022-09-01 ENCOUNTER — Other Ambulatory Visit: Payer: Self-pay | Admitting: Family Medicine

## 2022-10-19 ENCOUNTER — Encounter: Payer: Self-pay | Admitting: Physician Assistant

## 2022-10-19 ENCOUNTER — Ambulatory Visit (INDEPENDENT_AMBULATORY_CARE_PROVIDER_SITE_OTHER): Payer: Medicare HMO | Admitting: Physician Assistant

## 2022-10-19 VITALS — BP 130/78 | HR 84 | Temp 97.4°F | Ht 59.0 in | Wt 181.2 lb

## 2022-10-19 DIAGNOSIS — I10 Essential (primary) hypertension: Secondary | ICD-10-CM

## 2022-10-19 DIAGNOSIS — E782 Mixed hyperlipidemia: Secondary | ICD-10-CM

## 2022-10-19 DIAGNOSIS — N958 Other specified menopausal and perimenopausal disorders: Secondary | ICD-10-CM | POA: Diagnosis not present

## 2022-10-19 DIAGNOSIS — Z1231 Encounter for screening mammogram for malignant neoplasm of breast: Secondary | ICD-10-CM

## 2022-10-19 NOTE — Progress Notes (Signed)
Subjective:  Patient ID: Megan Christian, female    DOB: Mar 18, 1951  Age: 72 y.o. MRN: [id number]  Chief Complaint  Patient presents with   Hyperlipidemia   Hypertension    Hyperlipidemia  Hypertension    Pt presents for follow up of hypertension. The patient is tolerating the medication well without side effects. Compliance with treatment has been good; including taking medication as directed , maintains a healthy diet and regular exercise regimen , and following up as directed. She is currently taking zestril 10mg  qd  Mixed hyperlipidemia  Pt presents with hyperlipidemia. Compliance with treatment has been good The patient is compliant with medications, maintains a low cholesterol diet , follows up as directed , and maintains an exercise regimen . The patient denies experiencing any hypercholesterolemia related symptoms. She is taking pravachol 40mg  qd  Pt would like to schedule next mammogram and also dexa scan   Current Outpatient Medications on File Prior to Visit  Medication Sig Dispense Refill   lisinopril (ZESTRIL) 10 MG tablet TAKE 1 TABLET BY MOUTH DAILY 90 tablet 1   Multiple Vitamins-Iron (MULTIVITAMINS WITH IRON) TABS tablet Take 1 tablet by mouth daily.     pravastatin (PRAVACHOL) 40 MG tablet TAKE 1 TABLET(40 MG) BY MOUTH DAILY 90 tablet 1   No current facility-administered medications on file prior to visit.   History reviewed. No pertinent past medical history. Past Surgical History:  Procedure Laterality Date   NO PAST SURGERIES      Family History  Problem Relation Age of Onset   Emphysema Father    Diabetes Sister    Diabetes Brother    Breast cancer Neg Hx    Social History   Socioeconomic History   Marital status: Married    Spouse name: Not on file   Number of children: Not on file   Years of education: Not on file   Highest education level: Not on file  Occupational History   Occupation: Retired  Tobacco Use   Smoking status: Former     Types: Cigarettes    Quit date: 2016    Years since quitting: 8.2   Smokeless tobacco: Never  Vaping Use   Vaping Use: Never used  Substance and Sexual Activity   Alcohol use: Never   Drug use: Never   Sexual activity: Not Currently  Other Topics Concern   Not on file  Social History Narrative   Not on file   Social Determinants of Health   Financial Resource Strain: Low Risk  (02/08/2022)   Overall Financial Resource Strain (CARDIA)    Difficulty of Paying Living Expenses: Not hard at all  Food Insecurity: No Food Insecurity (02/08/2022)   Hunger Vital Sign    Worried About Running Out of Food in the Last Year: Never true    [place] in the Last Year: Never true  Transportation Needs: No Transportation Needs (02/08/2022)   PRAPARE - Hydrologist (Medical): No    Lack of Transportation (Non-Medical): No  Physical Activity: Inactive (02/08/2022)   Exercise Vital Sign    Days of Exercise per Week: 0 days    Minutes of Exercise per Session: 0 min  Stress: No Stress Concern Present (02/08/2022)   [place]    Feeling of Stress : Not at all  Social Connections: Unknown (02/08/2022)   Social Connection and Isolation Panel [NHANES]    Frequency of Communication with Friends  and Family: More than three times a week    Frequency of Social Gatherings with Friends and Family: More than three times a week    Attends Religious Services: Not on file    Active Member of Clubs or Organizations: No    Attends Archivist Meetings: Never    Marital Status: Married   CONSTITUTIONAL: Negative for chills, fatigue, fever, unintentional weight gain and unintentional weight loss.  E/N/T: Negative for ear pain, nasal congestion and sore throat.  CARDIOVASCULAR: Negative for chest pain, dizziness, palpitations and pedal edema.  RESPIRATORY: Negative for recent cough and dyspnea.   GASTROINTESTINAL: Negative for abdominal pain, acid reflux symptoms, constipation, diarrhea, nausea and vomiting.  MSK: Negative for arthralgias and myalgias.  INTEGUMENTARY: Negative for rash.  NEUROLOGICAL: Negative for dizziness and headaches.  PSYCHIATRIC: Negative for sleep disturbance and to question depression screen.  Negative for depression, negative for anhedonia.       Objective:  PHYSICAL EXAM:   VS: BP 130/78 (BP Location: Left Arm, Patient Position: Sitting, Cuff Size: Large)   Pulse 84   Temp (!) 97.4 F (36.3 C) (Temporal)   Ht 4\' 11"  (1.499 m)   Wt 181 lb 3.2 oz (82.2 kg)   SpO2 98%   BMI 36.60 kg/m   GEN: Well nourished, well developed, in no acute distress  Cardiac: RRR; no murmurs, rubs, or gallops,no edema - Respiratory:  normal respiratory rate and pattern with no distress - normal breath sounds with no rales, rhonchi, wheezes or rubs MS: no deformity or atrophy  Skin: warm and dry, no rash  Psych: euthymic mood, appropriate affect and demeanor   Lab Results  Component Value Date   WBC 6.9 04/14/2022   HGB 14.5 04/14/2022   HCT 43.9 04/14/2022   PLT 211 04/14/2022   GLUCOSE 94 04/14/2022   CHOL 149 04/14/2022   TRIG 94 04/14/2022   HDL 56 04/14/2022   LDLCALC 76 04/14/2022   ALT 25 04/14/2022   AST 23 04/14/2022   NA 144 04/14/2022   K 4.6 04/14/2022   CL 106 04/14/2022   CREATININE 0.90 04/14/2022   BUN 17 04/14/2022   CO2 21 04/14/2022   TSH 1.800 04/14/2022      Assessment & Plan:   Problem List Items Addressed This Visit       Cardiovascular and Mediastinum   Essential hypertension, benign (Chronic)   Relevant Medications   lisinopril (ZESTRIL) 10 MG tablet   Other Relevant Orders   CBC with Differential/Platelet   Comprehensive metabolic panel   TSH Continue current meds     Other   Mixed hyperlipidemia - Primary   Relevant Medications   lisinopril (ZESTRIL) 10 MG tablet   Other Relevant Orders   Lipid  panel Continue pravachol and watch diet   Other Visit Diagnoses     Vitamin D deficiency       Relevant Orders   VITAMIN D 25 Hydroxy (Vit-D Deficiency, Fractures)   Encounter for screening mammogram for breast cancer       Relevant Orders   MM DIGITAL SCREENING BILATERAL     .  No orders of the defined types were placed in this encounter.   Orders Placed This Encounter  Procedures   DG Bone Density   MM DIGITAL SCREENING BILATERAL   CBC with Differential/Platelet   Comprehensive metabolic panel   Lipid panel     Follow-up: Return in about 6 months (around 04/21/2023) for chronic fasting follow up.  An  After Visit Summary was printed and given to the patient.  Yetta Flock [facility] [phone]

## 2022-10-20 LAB — COMPREHENSIVE METABOLIC PANEL
ALT: 23 IU/L (ref 0–32)
AST: 26 IU/L (ref 0–40)
Albumin/Globulin Ratio: 1.8 (ref 1.2–2.2)
Albumin: 4.8 g/dL (ref 3.8–4.8)
Alkaline Phosphatase: 55 IU/L (ref 44–121)
BUN/Creatinine Ratio: 10 — ABNORMAL LOW (ref 12–28)
BUN: 10 mg/dL (ref 8–27)
Bilirubin Total: 0.4 mg/dL (ref 0.0–1.2)
CO2: 22 mmol/L (ref 20–29)
Calcium: 9.6 mg/dL (ref 8.7–10.3)
Chloride: 104 mmol/L (ref 96–106)
Creatinine, Ser: 0.99 mg/dL (ref 0.57–1.00)
Globulin, Total: 2.7 g/dL (ref 1.5–4.5)
Glucose: 95 mg/dL (ref 70–99)
Potassium: 4.8 mmol/L (ref 3.5–5.2)
Sodium: 143 mmol/L (ref 134–144)
Total Protein: 7.5 g/dL (ref 6.0–8.5)
eGFR: 60 mL/min/{1.73_m2} (ref >59)

## 2022-10-20 LAB — CBC WITH DIFFERENTIAL/PLATELET
Basophils Absolute: 0 10*3/uL (ref 0.0–0.2)
Basos: 1 %
EOS (ABSOLUTE): 0.2 10*3/uL (ref 0.0–0.4)
Eos: 3 %
Hematocrit: 46.1 % (ref 34.0–46.6)
Hemoglobin: 15.1 g/dL (ref 11.1–15.9)
Immature Grans (Abs): 0 10*3/uL (ref 0.0–0.1)
Immature Granulocytes: 0 %
Lymphocytes Absolute: 2.1 10*3/uL (ref 0.7–3.1)
Lymphs: 33 %
MCH: 28.4 pg (ref 26.6–33.0)
MCHC: 32.8 g/dL (ref 31.5–35.7)
MCV: 87 fL (ref 79–97)
Monocytes Absolute: 0.5 10*3/uL (ref 0.1–0.9)
Monocytes: 8 %
Neutrophils Absolute: 3.7 10*3/uL (ref 1.4–7.0)
Neutrophils: 55 %
Platelets: 201 10*3/uL (ref 150–450)
RBC: 5.32 x10E6/uL — ABNORMAL HIGH (ref 3.77–5.28)
RDW: 13.2 % (ref 11.7–15.4)
WBC: 6.5 10*3/uL (ref 3.4–10.8)

## 2022-10-20 LAB — LIPID PANEL
Chol/HDL Ratio: 2.6 ratio (ref 0.0–4.4)
Cholesterol, Total: 157 mg/dL (ref 100–199)
HDL: 61 mg/dL (ref >39)
LDL Chol Calc (NIH): 79 mg/dL (ref 0–99)
Triglycerides: 91 mg/dL (ref 0–149)
VLDL Cholesterol Cal: 17 mg/dL (ref 5–40)

## 2022-10-20 LAB — CARDIOVASCULAR RISK ASSESSMENT

## 2022-10-31 ENCOUNTER — Encounter: Payer: Self-pay | Admitting: Physician Assistant

## 2022-12-30 ENCOUNTER — Encounter: Payer: Self-pay | Admitting: Physician Assistant

## 2022-12-30 DIAGNOSIS — M85851 Other specified disorders of bone density and structure, right thigh: Secondary | ICD-10-CM | POA: Diagnosis not present

## 2022-12-30 DIAGNOSIS — M8580 Other specified disorders of bone density and structure, unspecified site: Secondary | ICD-10-CM | POA: Diagnosis not present

## 2022-12-30 LAB — HM DEXA SCAN

## 2023-03-04 ENCOUNTER — Other Ambulatory Visit: Payer: Self-pay | Admitting: Family Medicine

## 2023-03-20 ENCOUNTER — Other Ambulatory Visit: Payer: Self-pay | Admitting: Physician Assistant

## 2023-03-20 DIAGNOSIS — I10 Essential (primary) hypertension: Secondary | ICD-10-CM

## 2023-05-01 ENCOUNTER — Ambulatory Visit: Payer: Medicare HMO | Admitting: Physician Assistant

## 2023-05-10 ENCOUNTER — Ambulatory Visit
Admission: RE | Admit: 2023-05-10 | Discharge: 2023-05-10 | Disposition: A | Discharge status: Home / Self Care | Payer: Medicare HMO | Source: Ambulatory Visit | Attending: Physician Assistant | Admitting: Physician Assistant

## 2023-05-10 DIAGNOSIS — Z1231 Encounter for screening mammogram for malignant neoplasm of breast: Secondary | ICD-10-CM

## 2023-05-11 ENCOUNTER — Encounter: Payer: Self-pay | Admitting: Physician Assistant

## 2023-05-11 ENCOUNTER — Ambulatory Visit: Payer: Medicare HMO | Admitting: Physician Assistant

## 2023-05-11 VITALS — BP 120/64 | HR 87 | Temp 97.3°F | Ht 59.0 in | Wt 180.2 lb

## 2023-05-11 DIAGNOSIS — Z23 Encounter for immunization: Secondary | ICD-10-CM

## 2023-05-11 DIAGNOSIS — E782 Mixed hyperlipidemia: Secondary | ICD-10-CM | POA: Diagnosis not present

## 2023-05-11 DIAGNOSIS — I10 Essential (primary) hypertension: Secondary | ICD-10-CM

## 2023-05-11 DIAGNOSIS — E559 Vitamin D deficiency, unspecified: Secondary | ICD-10-CM

## 2023-05-11 NOTE — Progress Notes (Signed)
Subjective:  Patient ID: Megan Christian, female    DOB: 1950-12-15  Age: 72 y.o. MRN: [id number]  Chief Complaint  Patient presents with   Medical Management of Chronic Issues    Hyperlipidemia  Hypertension    Pt presents for follow up of hypertension. The patient is tolerating the medication well without side effects. Compliance with treatment has been good; including taking medication as directed , maintains a healthy diet and regular exercise regimen , and following up as directed. She is currently taking zestril 10mg  qd  Mixed hyperlipidemia  Pt presents with hyperlipidemia. Compliance with treatment has been good The patient is compliant with medications, maintains a low cholesterol diet , follows up as directed , and maintains an exercise regimen . The patient denies experiencing any hypercholesterolemia related symptoms. She is taking pravachol 40mg  qd  Pt with vit D def - currently taking only mvt daily - due for labwork  Pt would like flu shot today   Current Outpatient Medications on File Prior to Visit  Medication Sig Dispense Refill   lisinopril (ZESTRIL) 10 MG tablet TAKE 1 TABLET(10 MG) BY MOUTH DAILY 90 tablet 1   Multiple Vitamins-Iron (MULTIVITAMINS WITH IRON) TABS tablet Take 1 tablet by mouth daily.     pravastatin (PRAVACHOL) 40 MG tablet TAKE 1 TABLET(40 MG) BY MOUTH DAILY 90 tablet 0   No current facility-administered medications on file prior to visit.   History reviewed. No pertinent past medical history. Past Surgical History:  Procedure Laterality Date   NO PAST SURGERIES      Family History  Problem Relation Age of Onset   Emphysema Father    Diabetes Sister    Diabetes Brother    Breast cancer Neg Hx    Social History   Socioeconomic History   Marital status: Married    Spouse name: Not on file   Number of children: Not on file   Years of education: Not on file   Highest education level: Not on file  Occupational History    Occupation: Retired  Tobacco Use   Smoking status: Former    Current packs/day: 0.00    Types: Cigarettes    Quit date: 2016    Years since quitting: 8.7   Smokeless tobacco: Never  Vaping Use   Vaping status: Never Used  Substance and Sexual Activity   Alcohol use: Never   Drug use: Never   Sexual activity: Not Currently  Other Topics Concern   Not on file  Social History Narrative   Not on file   Social Determinants of Health   Financial Resource Strain: Low Risk  (05/11/2023)   Overall Financial Resource Strain (CARDIA)    Difficulty of Paying Living Expenses: Not hard at all  Food Insecurity: No Food Insecurity (05/11/2023)   Hunger Vital Sign    Worried About Running Out of Food in the Last Year: Never true    Ran Out of Food in the Last Year: Never true  Transportation Needs: No Transportation Needs (05/11/2023)   PRAPARE - Administrator, Civil Service (Medical): No    Lack of Transportation (Non-Medical): No  Physical Activity: Inactive (05/11/2023)   Exercise Vital Sign    Days of Exercise per Week: 0 days    Minutes of Exercise per Session: 0 min  Stress: No Stress Concern Present (05/11/2023)   [employer] of Occupational Health - Occupational Stress Questionnaire    Feeling of Stress : Not at all  Social  Connections: Moderately Integrated (05/11/2023)   Social Connection and Isolation Panel [NHANES]    Frequency of Communication with Friends and Family: More than three times a week    Frequency of Social Gatherings with Friends and Family: More than three times a week    Attends Religious Services: More than 4 times per year    Active Member of [employer] or Organizations: No    Attends Banker Meetings: Never    Marital Status: Married   CONSTITUTIONAL: Negative for chills, fatigue, fever, unintentional weight gain and unintentional weight loss.  E/N/T: Negative for ear pain, nasal congestion and sore throat.  CARDIOVASCULAR:  Negative for chest pain, dizziness, palpitations and pedal edema.  RESPIRATORY: Negative for recent cough and dyspnea.  GASTROINTESTINAL: Negative for abdominal pain, acid reflux symptoms, constipation, diarrhea, nausea and vomiting.  MSK: Negative for arthralgias and myalgias.  INTEGUMENTARY: Negative for rash.  NEUROLOGICAL: Negative for dizziness and headaches.  PSYCHIATRIC: Negative for sleep disturbance and to question depression screen.  Negative for depression, negative for anhedonia.       Objective:  PHYSICAL EXAM:   VS: BP 120/64 (BP Location: Left Arm, Patient Position: Sitting, Cuff Size: Large)   Pulse 87   Temp (!) 97.3 F (36.3 C) (Temporal)   Ht 4\' 11"  (1.499 m)   Wt 180 lb 3.2 oz (81.7 kg)   SpO2 98%   BMI 36.40 kg/m   GEN: Well nourished, well developed, in no acute distress  Cardiac: RRR; no murmurs, rubs, or gallops,no edema -  Respiratory:  normal respiratory rate and pattern with no distress - normal breath sounds with no rales, rhonchi, wheezes or rubs GI: normal bowel sounds, no masses or tenderness  Skin: warm and dry, no rash   Psych: euthymic mood, appropriate affect and demeanor    Lab Results  Component Value Date   WBC 6.5 10/19/2022   HGB 15.1 10/19/2022   HCT 46.1 10/19/2022   PLT 201 10/19/2022   GLUCOSE 95 10/19/2022   CHOL 157 10/19/2022   TRIG 91 10/19/2022   HDL 61 10/19/2022   LDLCALC 79 10/19/2022   ALT 23 10/19/2022   AST 26 10/19/2022   NA 143 10/19/2022   K 4.8 10/19/2022   CL 104 10/19/2022   CREATININE 0.99 10/19/2022   BUN 10 10/19/2022   CO2 22 10/19/2022   TSH 1.800 04/14/2022      Assessment & Plan:   Problem List Items Addressed This Visit       Cardiovascular and Mediastinum   Essential hypertension, benign (Chronic)   Relevant Medications   lisinopril (ZESTRIL) 10 MG tablet   Other Relevant Orders   CBC with Differential/Platelet   Comprehensive metabolic panel   TSH Continue current meds      Other   Mixed hyperlipidemia - Primary   Relevant Medications   lisinopril (ZESTRIL) 10 MG tablet   Other Relevant Orders   Lipid panel Continue pravachol and watch diet   Other Visit Diagnoses     Vitamin D deficiency       Relevant Orders   VITAMIN D 25 Hydroxy (Vit-D Deficiency, Fractures)      Need flu vaccine Fluad given        .  No orders of the defined types were placed in this encounter.   Orders Placed This Encounter  Procedures   Flu Vaccine Trivalent High Dose (Fluad)   CBC with Differential/Platelet   Comprehensive metabolic panel   TSH   Lipid panel  VITAMIN D 25 Hydroxy (Vit-D Deficiency, Fractures)     Follow-up: Return in about 6 months (around 11/09/2023) for chronic fasting follow-up and MCR well by phone with nurse.  An After Visit Summary was printed and given to the patient.  Jettie Pagan [facility] [phone]

## 2023-05-12 LAB — LIPID PANEL
Chol/HDL Ratio: 2.7 {ratio} (ref 0.0–4.4)
Cholesterol, Total: 161 mg/dL (ref 100–199)
HDL: 59 mg/dL (ref >39)
LDL Chol Calc (NIH): 84 mg/dL (ref 0–99)
Triglycerides: 96 mg/dL (ref 0–149)
VLDL Cholesterol Cal: 18 mg/dL (ref 5–40)

## 2023-05-12 LAB — CBC WITH DIFFERENTIAL/PLATELET
Basophils Absolute: 0.1 10*3/uL (ref 0.0–0.2)
Basos: 1 %
EOS (ABSOLUTE): 0.2 10*3/uL (ref 0.0–0.4)
Eos: 3 %
Hematocrit: 45.8 % (ref 34.0–46.6)
Hemoglobin: 14.4 g/dL (ref 11.1–15.9)
Immature Grans (Abs): 0 10*3/uL (ref 0.0–0.1)
Immature Granulocytes: 0 %
Lymphocytes Absolute: 2.3 10*3/uL (ref 0.7–3.1)
Lymphs: 35 %
MCH: 28.2 pg (ref 26.6–33.0)
MCHC: 31.4 g/dL — ABNORMAL LOW (ref 31.5–35.7)
MCV: 90 fL (ref 79–97)
Monocytes Absolute: 0.6 10*3/uL (ref 0.1–0.9)
Monocytes: 9 %
Neutrophils Absolute: 3.4 10*3/uL (ref 1.4–7.0)
Neutrophils: 52 %
Platelets: 213 10*3/uL (ref 150–450)
RBC: 5.1 x10E6/uL (ref 3.77–5.28)
RDW: 13 % (ref 11.7–15.4)
WBC: 6.5 10*3/uL (ref 3.4–10.8)

## 2023-05-12 LAB — COMPREHENSIVE METABOLIC PANEL
ALT: 23 [IU]/L (ref 0–32)
AST: 25 [IU]/L (ref 0–40)
Albumin: 4.8 g/dL (ref 3.8–4.8)
Alkaline Phosphatase: 51 [IU]/L (ref 44–121)
BUN/Creatinine Ratio: 18 (ref 12–28)
BUN: 16 mg/dL (ref 8–27)
Bilirubin Total: 0.3 mg/dL (ref 0.0–1.2)
CO2: 22 mmol/L (ref 20–29)
Calcium: 9.5 mg/dL (ref 8.7–10.3)
Chloride: 103 mmol/L (ref 96–106)
Creatinine, Ser: 0.91 mg/dL (ref 0.57–1.00)
Globulin, Total: 2.4 g/dL (ref 1.5–4.5)
Glucose: 82 mg/dL (ref 70–99)
Potassium: 4.8 mmol/L (ref 3.5–5.2)
Sodium: 142 mmol/L (ref 134–144)
Total Protein: 7.2 g/dL (ref 6.0–8.5)
eGFR: 66 mL/min/{1.73_m2} (ref >59)

## 2023-05-12 LAB — VITAMIN D 25 HYDROXY (VIT D DEFICIENCY, FRACTURES): Vit D, 25-Hydroxy: 37.2 ng/mL (ref 30.0–100.0)

## 2023-05-12 LAB — TSH: TSH: 2.02 u[IU]/mL (ref 0.450–4.500)

## 2023-06-06 ENCOUNTER — Other Ambulatory Visit: Payer: Self-pay | Admitting: Family Medicine

## 2023-06-07 ENCOUNTER — Encounter: Payer: Self-pay | Admitting: Family Medicine

## 2023-06-07 ENCOUNTER — Ambulatory Visit (INDEPENDENT_AMBULATORY_CARE_PROVIDER_SITE_OTHER): Payer: Medicare HMO | Admitting: Family Medicine

## 2023-06-07 VITALS — BP 119/71 | HR 79 | Ht 59.0 in | Wt 180.0 lb

## 2023-06-07 DIAGNOSIS — Z0000 Encounter for general adult medical examination without abnormal findings: Secondary | ICD-10-CM | POA: Diagnosis not present

## 2023-06-07 NOTE — Progress Notes (Signed)
Subjective:   Megan Christian is a 72 y.o. female who presents for Medicare Annual (Subsequent) preventive examination.  Visit Complete: Virtual I connected with  Megan Christian on 06/07/23 by a audio enabled telemedicine application and verified that I am speaking with the correct person using two identifiers.  Patient Location: Home  Provider Location: Office/Clinic  I discussed the limitations of evaluation and management by telemedicine. The patient expressed understanding and agreed to proceed.  Vital Signs: Because this visit was a virtual/telehealth visit, some criteria may be missing or patient reported. Any vitals not documented were not able to be obtained and vitals that have been documented are patient reported.  Patient Medicare AWV questionnaire was completed by the patient; I have confirmed that all information answered by patient is correct and no changes since this date.  Cardiac Risk Factors include: advanced age (>81men, >46 women);obesity (BMI >30kg/m2);hypertension;dyslipidemia     Objective:    Today's Vitals   06/07/23 1110 06/07/23 1122  BP: 119/71   Pulse: 79   SpO2: 96%   Weight: 180 lb (81.6 kg)   Height: 4\' 11"  (1.499 m)   PainSc: 0-No pain 0-No pain   Body mass index is 36.36 kg/m.     02/08/2022   12:59 PM 11/11/2021   10:33 AM  Advanced Directives  Does Patient Have a Medical Advance Directive? No No  Would patient like information on creating a medical advance directive? No - Patient declined Yes (ED - Information included in AVS)    Current Medications (verified) Outpatient Encounter Medications as of 06/07/2023  Medication Sig   lisinopril (ZESTRIL) 10 MG tablet TAKE 1 TABLET(10 MG) BY MOUTH DAILY   Multiple Vitamins-Iron (MULTIVITAMINS WITH IRON) TABS tablet Take 1 tablet by mouth daily.   pravastatin (PRAVACHOL) 40 MG tablet TAKE 1 TABLET(40 MG) BY MOUTH DAILY   No facility-administered encounter medications on file as of  06/07/2023.    Allergies (verified) Patient has no known allergies.   History: History reviewed. No pertinent past medical history. Past Surgical History:  Procedure Laterality Date   NO PAST SURGERIES     Family History  Problem Relation Age of Onset   Emphysema Father    Diabetes Sister    Diabetes Brother    Breast cancer Neg Hx    Social History   Socioeconomic History   Marital status: Married    Spouse name: Not on file   Number of children: Not on file   Years of education: Not on file   Highest education level: Not on file  Occupational History   Occupation: Retired  Tobacco Use   Smoking status: Former    Current packs/day: 0.00    Types: Cigarettes    Quit date: 2016    Years since quitting: 8.8   Smokeless tobacco: Never  Vaping Use   Vaping status: Never Used  Substance and Sexual Activity   Alcohol use: Never   Drug use: Never   Sexual activity: Not Currently  Other Topics Concern   Not on file  Social History Narrative   Not on file   Social Determinants of Health   Financial Resource Strain: Low Risk  (05/11/2023)   Overall Financial Resource Strain (CARDIA)    Difficulty of Paying Living Expenses: Not hard at all  Food Insecurity: No Food Insecurity (05/11/2023)   Hunger Vital Sign    Worried About Running Out of Food in the Last Year: Never true    Ran Out of  Food in the Last Year: Never true  Transportation Needs: No Transportation Needs (05/11/2023)   PRAPARE - Administrator, Civil Service (Medical): No    Lack of Transportation (Non-Medical): No  Physical Activity: Inactive (05/11/2023)   Exercise Vital Sign    Days of Exercise per Week: 0 days    Minutes of Exercise per Session: 0 min  Stress: No Stress Concern Present (05/11/2023)   [employer] of Occupational Health - Occupational Stress Questionnaire    Feeling of Stress : Not at all  Social Connections: Moderately Integrated (05/11/2023)   Social Connection  and Isolation Panel [NHANES]    Frequency of Communication with Friends and Family: More than three times a week    Frequency of Social Gatherings with Friends and Family: More than three times a week    Attends Religious Services: More than 4 times per year    Active Member of [employer] or Organizations: No    Attends Engineer, structural: Never    Marital Status: Married    Tobacco Counseling Counseling given: Not Answered   MAKE SURE TO DO MEDICARE RISKS  Clinical Intake:  Pre-visit preparation completed: No  Pain : No/denies pain Pain Score: 0-No pain     BMI - recorded: 36.36 Nutritional Status: BMI > 30  Obese Nutritional Risks: None Diabetes: No  How often do you need to have someone help you when you read instructions, pamphlets, or other written materials from your doctor or pharmacy?: 1 - Never  Interpreter Needed?: No      Activities of Daily Living    06/07/2023   11:23 AM  In your present state of health, do you have any difficulty performing the following activities:  Hearing? 0  Vision? 0  Difficulty concentrating or making decisions? 0  Walking or climbing stairs? 0  Dressing or bathing? 0  Doing errands, shopping? 0  Preparing Food and eating ? N  Using the Toilet? N  In the past six months, have you accidently leaked urine? N  Do you have problems with loss of bowel control? N  Managing your Medications? N  Managing your Finances? N  Housekeeping or managing your Housekeeping? N    Patient Care Team: Marianne Sofia, Cordelia Poche as PCP - General (Physician Assistant)  Indicate any recent Medical Services you may have received from other than [facility] providers in the past year (date may be approximate).     Assessment:   This is a routine wellness examination for Megan Christian.  Hearing/Vision screen No results found.   Goals Addressed             This Visit's Progress    Client will verbalize knowledge of self management of Hypertension as  evidences by BP reading of 140/90 or less; or as defined by provider   On track    Take medicines regularly and be active      Depression Screen    06/07/2023   11:19 AM 05/11/2023   10:42 AM 10/19/2022    9:07 AM 02/08/2022   12:57 PM 11/11/2021   10:33 AM 04/06/2021    8:52 AM 01/01/2020    9:58 AM  PHQ 2/9 Scores  PHQ - 2 Score 0 0 0 0 0 0 0    Fall Risk    06/07/2023   11:17 AM 05/11/2023   10:42 AM 10/19/2022    9:07 AM 02/08/2022    1:00 PM 11/11/2021   10:33 AM  Fall Risk  Falls in the past year? 0 0 0 0 0  Number falls in past yr: 0 0 0 0 0  Injury with Fall? 0 0 0 0 0  Risk for fall due to : No Fall Risks No Fall Risks No Fall Risks No Fall Risks No Fall Risks  Follow up Falls evaluation completed Falls evaluation completed Falls evaluation completed Falls evaluation completed;Education provided;Falls prevention discussed Education provided;Falls prevention discussed    MEDICARE RISK AT HOME: Medicare Risk at Home Any stairs in or around the home?: Yes If so, are there any without handrails?: Yes Home free of loose throw rugs in walkways, pet beds, electrical cords, etc?: No Adequate lighting in your home to reduce risk of falls?: Yes Life alert?: No Use of a cane, walker or w/c?: No Grab bars in the bathroom?: No Shower chair or bench in shower?: No Elevated toilet seat or a handicapped toilet?: No  TIMED UP AND GO:  Was the test performed?  No    Cognitive Function:        06/07/2023   11:19 AM 02/08/2022    1:01 PM  6CIT Screen  What Year? 0 points 0 points  What month? 0 points 0 points  What time? 0 points 0 points  Count back from 20 0 points 0 points  Months in reverse 0 points 0 points  Repeat phrase 0 points 0 points  Total Score 0 points 0 points    Immunizations Immunization History  Administered Date(s) Administered   Fluad Quad(high Dose 65+) 05/03/2019, 05/04/2021, 05/11/2022   Fluad Trivalent(High Dose 65+) 05/11/2023   Influenza,  High Dose Seasonal PF 04/30/2018, 05/14/2020   Influenza-Unspecified 04/19/2017   PFIZER(Purple Top)SARS-COV-2 Vaccination 03/05/2020, 03/26/2020   Pneumococcal Conjugate-13 04/21/2014, 04/24/2018   Pneumococcal Polysaccharide-23 04/19/2016, 04/19/2017   Zoster, Live 01/14/2015    Screening Tests Health Maintenance  Topic Date Due   DTaP/Tdap/Td (1 - Tdap) Never done   COVID-19 Vaccine (3 - 2023-24 season) 04/02/2023   Zoster Vaccines- Shingrix (1 of 2) 08/11/2023 (Originally 10/15/1998)   MAMMOGRAM  05/09/2024   Medicare Annual Wellness (AWV)  06/06/2024   Colonoscopy  02/10/2025   Pneumonia Vaccine 37+ Years old  Completed   INFLUENZA VACCINE  Completed   DEXA SCAN  Completed   HPV VACCINES  Aged Out   Hepatitis C Screening  Discontinued    Health Maintenance  Health Maintenance Due  Topic Date Due   DTaP/Tdap/Td (1 - Tdap) Never done   COVID-19 Vaccine (3 - 2023-24 season) 04/02/2023    Additional Screening:  Vision Screening: Recommended annual ophthalmology exams for early detection of glaucoma and other disorders of the eye. Is the patient up to date with their annual eye exam?  Yes  Who is the provider or what is the name of the office in which the patient attends annual eye exams? Due this month, Dr. Lawernce Ion If pt is not established with a provider, would they like to be referred to a provider to establish care? No .   Dental Screening: Recommended annual dental exams for proper oral hygiene  Community Resource Referral / Chronic Care Management: CRR required this visit?  No   CCM required this visit?  No     Plan:     I have personally reviewed and noted the following in the patient's chart:   Medical and social history Use of alcohol, tobacco or illicit drugs  Current medications and supplements including opioid prescriptions. Patient is not currently taking opioid  prescriptions. Functional ability and status Nutritional status Physical  activity Advanced directives List of other physicians Hospitalizations, surgeries, and ER visits in previous 12 months Vitals Screenings to include cognitive, depression, and falls Referrals and appointments  In addition, I have reviewed and discussed with patient certain preventive protocols, quality metrics, and best practice recommendations. A written personalized care plan for preventive services as well as general preventive health recommendations were provided to patient.     Lajuana Matte, FNP                      [id number] [phone]

## 2023-09-02 ENCOUNTER — Other Ambulatory Visit: Payer: Self-pay | Admitting: Physician Assistant

## 2023-11-17 ENCOUNTER — Encounter: Payer: Self-pay | Admitting: Physician Assistant

## 2023-11-17 ENCOUNTER — Ambulatory Visit (INDEPENDENT_AMBULATORY_CARE_PROVIDER_SITE_OTHER): Payer: Medicare HMO | Admitting: Physician Assistant

## 2023-11-17 VITALS — BP 138/76 | HR 97 | Temp 98.2°F | Resp 18 | Ht 59.0 in | Wt 182.6 lb

## 2023-11-17 DIAGNOSIS — E782 Mixed hyperlipidemia: Secondary | ICD-10-CM | POA: Diagnosis not present

## 2023-11-17 DIAGNOSIS — I10 Essential (primary) hypertension: Secondary | ICD-10-CM

## 2023-11-17 MED ORDER — LISINOPRIL 10 MG PO TABS: 10.0000 mg | ORAL_TABLET | Freq: Every day | ORAL | 1 refills | Status: DC

## 2023-11-17 MED ORDER — PRAVASTATIN SODIUM 40 MG PO TABS: 40.0000 mg | ORAL_TABLET | Freq: Every day | ORAL | 1 refills | Status: DC

## 2023-11-17 NOTE — Progress Notes (Signed)
 Subjective:  Patient ID: Megan Christian, female    DOB: Aug 13, 1950  Age: 73 y.o. MRN: [id number]  Chief Complaint  Patient presents with   Medical Management of Chronic Issues    Hyperlipidemia  Hypertension    Pt presents for follow up of hypertension. The patient is tolerating the medication well without side effects. Compliance with treatment has been good; including taking medication as directed , maintains a healthy diet and regular exercise regimen , and following up as directed. She is currently taking zestril  10mg  qd  Mixed hyperlipidemia  Pt presents with hyperlipidemia. Compliance with treatment has been good The patient is compliant with medications, maintains a low cholesterol diet , follows up as directed , and maintains an exercise regimen . The patient denies experiencing any hypercholesterolemia related symptoms. She is taking pravachol  40mg  qd   Current Outpatient Medications on File Prior to Visit  Medication Sig Dispense Refill   Multiple Vitamins-Iron (MULTIVITAMINS WITH IRON) TABS tablet Take 1 tablet by mouth daily.     No current facility-administered medications on file prior to visit.   History reviewed. No pertinent past medical history. Past Surgical History:  Procedure Laterality Date   NO PAST SURGERIES      Family History  Problem Relation Age of Onset   Emphysema Father    Diabetes Sister    Diabetes Brother    Breast cancer Neg Hx    Social History   Socioeconomic History   Marital status: Married    Spouse name: Not on file   Number of children: Not on file   Years of education: Not on file   Highest education level: Not on file  Occupational History   Occupation: Retired  Tobacco Use   Smoking status: Former    Current packs/day: 0.00    Types: Cigarettes    Quit date: 2016    Years since quitting: 9.3   Smokeless tobacco: Never  Vaping Use   Vaping status: Never Used  Substance and Sexual Activity   Alcohol use: Never    Drug use: Never   Sexual activity: Not Currently  Other Topics Concern   Not on file  Social History Narrative   Not on file   Social Drivers of Health   Financial Resource Strain: Low Risk  (05/11/2023)   Overall Financial Resource Strain (CARDIA)    Difficulty of Paying Living Expenses: Not hard at all  Food Insecurity: No Food Insecurity (05/11/2023)   Hunger Vital Sign    Worried About Running Out of Food in the Last Year: Never true    Ran Out of Food in the Last Year: Never true  Transportation Needs: No Transportation Needs (05/11/2023)   PRAPARE - Administrator, Civil Service (Medical): No    Lack of Transportation (Non-Medical): No  Physical Activity: Inactive (05/11/2023)   Exercise Vital Sign    Days of Exercise per Week: 0 days    Minutes of Exercise per Session: 0 min  Stress: No Stress Concern Present (05/11/2023)   [employer] of Occupational Health - Occupational Stress Questionnaire    Feeling of Stress : Not at all  Social Connections: Moderately Integrated (05/11/2023)   Social Connection and Isolation Panel [NHANES]    Frequency of Communication with Friends and Family: More than three times a week    Frequency of Social Gatherings with Friends and Family: More than three times a week    Attends Religious Services: More than 4 times per year  Active Member of Clubs or Organizations: No    Attends Banker Meetings: Never    Marital Status: Married   CONSTITUTIONAL: Negative for chills, fatigue, fever, E/N/T: Negative for ear pain, nasal congestion and sore throat.  CARDIOVASCULAR: Negative for chest pain, dizziness, palpitations and pedal edema.  RESPIRATORY: Negative for recent cough and dyspnea.  GASTROINTESTINAL: Negative for abdominal pain, acid reflux symptoms, constipation, diarrhea, nausea and vomiting.  PSYCHIATRIC: Negative for sleep disturbance and to question depression screen.  Negative for depression, negative  for anhedonia.       Objective:  PHYSICAL EXAM:   VS: BP 138/76   Pulse 97   Temp 98.2 F (36.8 C) (Temporal)   Resp 18   Ht 4\' 11"  (1.499 m)   Wt 182 lb 9.6 oz (82.8 kg)   SpO2 93%   BMI 36.88 kg/m   GEN: Well nourished, well developed, in no acute distress   Cardiac: RRR; no murmurs, Respiratory:  normal respiratory rate and pattern with no distress - normal breath sounds with no rales, rhonchi, wheezes or rubs  Skin: warm and dry, no rash  Neuro:  Alert and Oriented x 3, - CN II-Xii grossly intact Psych: euthymic mood, appropriate affect and demeanor     Lab Results  Component Value Date   WBC 6.5 05/11/2023   HGB 14.4 05/11/2023   HCT 45.8 05/11/2023   PLT 213 05/11/2023   GLUCOSE 82 05/11/2023   CHOL 161 05/11/2023   TRIG 96 05/11/2023   HDL 59 05/11/2023   LDLCALC 84 05/11/2023   ALT 23 05/11/2023   AST 25 05/11/2023   NA 142 05/11/2023   K 4.8 05/11/2023   CL 103 05/11/2023   CREATININE 0.91 05/11/2023   BUN 16 05/11/2023   CO2 22 05/11/2023   TSH 2.020 05/11/2023      Assessment & Plan:   Problem List Items Addressed This Visit       Cardiovascular and Mediastinum   Essential hypertension, benign (Chronic)   Relevant Medications   lisinopril  (ZESTRIL ) 10 MG tablet   Other Relevant Orders   CBC with Differential/Platelet   Comprehensive metabolic panel   TSH Continue current meds     Other   Mixed hyperlipidemia - Primary   Relevant Medications   lisinopril  (ZESTRIL ) 10 MG tablet   Other Relevant Orders   Lipid panel Continue pravachol  and watch diet   Other Visit Diagnoses                         .  Meds ordered this encounter  Medications   lisinopril  (ZESTRIL ) 10 MG tablet    Sig: Take 1 tablet (10 mg total) by mouth daily.    Dispense:  90 tablet    Refill:  1    Supervising Provider:   [facility], KIRSTEN [[id number]]   pravastatin  (PRAVACHOL ) 40 MG tablet    Sig: Take 1 tablet (40 mg total) by mouth daily.     Dispense:  90 tablet    Refill:  1    Supervising Provider:   [facility], KIRSTEN [phone]    Orders Placed This Encounter  Procedures   CBC with Differential/Platelet   Comprehensive metabolic panel with GFR   Lipid panel     Follow-up: Return in about 6 months (around 05/18/2024) for chronic fasting follow-up.  An After Visit Summary was printed and given to the patient.  Anthonette Bastos [facility] [phone]

## 2023-11-18 LAB — CBC WITH DIFFERENTIAL/PLATELET
Basophils Absolute: 0.1 10*3/uL (ref 0.0–0.2)
Basos: 1 %
EOS (ABSOLUTE): 0.2 10*3/uL (ref 0.0–0.4)
Eos: 4 %
Hematocrit: 46.2 % (ref 34.0–46.6)
Hemoglobin: 14.8 g/dL (ref 11.1–15.9)
Immature Grans (Abs): 0 10*3/uL (ref 0.0–0.1)
Immature Granulocytes: 0 %
Lymphocytes Absolute: 2 10*3/uL (ref 0.7–3.1)
Lymphs: 34 %
MCH: 28.1 pg (ref 26.6–33.0)
MCHC: 32 g/dL (ref 31.5–35.7)
MCV: 88 fL (ref 79–97)
Monocytes Absolute: 0.4 10*3/uL (ref 0.1–0.9)
Monocytes: 7 %
Neutrophils Absolute: 3.2 10*3/uL (ref 1.4–7.0)
Neutrophils: 54 %
Platelets: 191 10*3/uL (ref 150–450)
RBC: 5.26 x10E6/uL (ref 3.77–5.28)
RDW: 12.9 % (ref 11.7–15.4)
WBC: 5.9 10*3/uL (ref 3.4–10.8)

## 2023-11-18 LAB — LIPID PANEL
Chol/HDL Ratio: 2.7 ratio (ref 0.0–4.4)
Cholesterol, Total: 159 mg/dL (ref 100–199)
HDL: 58 mg/dL (ref >39)
LDL Chol Calc (NIH): 82 mg/dL (ref 0–99)
Triglycerides: 102 mg/dL (ref 0–149)
VLDL Cholesterol Cal: 19 mg/dL (ref 5–40)

## 2023-11-18 LAB — COMPREHENSIVE METABOLIC PANEL WITH GFR
ALT: 24 IU/L (ref 0–32)
AST: 26 IU/L (ref 0–40)
Albumin: 4.8 g/dL (ref 3.8–4.8)
Alkaline Phosphatase: 51 IU/L (ref 44–121)
BUN/Creatinine Ratio: 14 (ref 12–28)
BUN: 13 mg/dL (ref 8–27)
Bilirubin Total: 0.5 mg/dL (ref 0.0–1.2)
CO2: 22 mmol/L (ref 20–29)
Calcium: 9.5 mg/dL (ref 8.7–10.3)
Chloride: 103 mmol/L (ref 96–106)
Creatinine, Ser: 0.95 mg/dL (ref 0.57–1.00)
Globulin, Total: 2.6 g/dL (ref 1.5–4.5)
Glucose: 85 mg/dL (ref 70–99)
Potassium: 4.7 mmol/L (ref 3.5–5.2)
Sodium: 142 mmol/L (ref 134–144)
Total Protein: 7.4 g/dL (ref 6.0–8.5)
eGFR: 62 mL/min/{1.73_m2} (ref >59)

## 2024-02-24 ENCOUNTER — Other Ambulatory Visit: Payer: Self-pay | Admitting: Physician Assistant

## 2024-02-24 DIAGNOSIS — E782 Mixed hyperlipidemia: Secondary | ICD-10-CM

## 2024-05-14 ENCOUNTER — Other Ambulatory Visit: Payer: Self-pay | Admitting: Family Medicine

## 2024-05-14 DIAGNOSIS — Z1231 Encounter for screening mammogram for malignant neoplasm of breast: Secondary | ICD-10-CM

## 2024-05-17 ENCOUNTER — Ambulatory Visit
Admission: RE | Admit: 2024-05-17 | Discharge: 2024-05-17 | Disposition: A | Source: Ambulatory Visit | Attending: Physician Assistant | Admitting: Physician Assistant

## 2024-05-17 DIAGNOSIS — Z1231 Encounter for screening mammogram for malignant neoplasm of breast: Secondary | ICD-10-CM | POA: Diagnosis not present

## 2024-05-20 ENCOUNTER — Encounter: Payer: Self-pay | Admitting: Physician Assistant

## 2024-05-20 ENCOUNTER — Ambulatory Visit: Admitting: Physician Assistant

## 2024-05-20 VITALS — BP 128/76 | HR 79 | Temp 97.8°F | Resp 18 | Ht 59.0 in | Wt 184.6 lb

## 2024-05-20 DIAGNOSIS — E782 Mixed hyperlipidemia: Secondary | ICD-10-CM | POA: Diagnosis not present

## 2024-05-20 DIAGNOSIS — R5383 Other fatigue: Secondary | ICD-10-CM | POA: Diagnosis not present

## 2024-05-20 DIAGNOSIS — I10 Essential (primary) hypertension: Secondary | ICD-10-CM

## 2024-05-20 LAB — POCT LIPID PANEL
HDL: 47
LDL: 100
Non-HDL: 116
TC/HDL: 2.1
TC: 163
TRG: 82

## 2024-05-20 MED ORDER — LISINOPRIL 10 MG PO TABS: 10.0000 mg | ORAL_TABLET | Freq: Every day | ORAL | 1 refills | Status: DC

## 2024-05-20 NOTE — Progress Notes (Signed)
 Subjective:  Patient ID: Megan Christian, female    DOB: 02/02/1951  Age: 73 y.o. MRN: [id number]  Chief Complaint  Patient presents with   Medical Management of Chronic Issues    Hyperlipidemia  Hypertension    Pt presents for follow up of hypertension. The patient is tolerating the medication well without side effects. Compliance with treatment has been good; including taking medication as directed , maintains a healthy diet and regular exercise regimen , and following up as directed. She is currently taking zestril  10mg  qd BP today stable at 128/76 She denies chest pain/sob/dyspnea  Mixed hyperlipidemia  Pt presents with hyperlipidemia. Compliance with treatment has been good The patient is compliant with medications, maintains a low cholesterol diet , follows up as directed , and maintains an exercise regimen . The patient denies experiencing any hypercholesterolemia related symptoms. She is taking pravachol  40mg  qd In office lipid test ordered and done today  Pt is currently taking MVT with iron and has history of iron deficiency in the past - she states that she has had mild fatigue and would like to know what her iron levels are as well as to check her thyroid  because she has a family history of thyroid  disease Current Outpatient Medications on File Prior to Visit  Medication Sig Dispense Refill   Multiple Vitamins-Iron (MULTIVITAMINS WITH IRON) TABS tablet Take 1 tablet by mouth daily.     pravastatin  (PRAVACHOL ) 40 MG tablet TAKE 1 TABLET(40 MG) BY MOUTH DAILY 90 tablet 1   No current facility-administered medications on file prior to visit.   History reviewed. No pertinent past medical history. Past Surgical History:  Procedure Laterality Date   NO PAST SURGERIES      Family History  Problem Relation Age of Onset   Emphysema Father    Diabetes Sister    Diabetes Brother    Breast cancer Neg Hx    Social History   Socioeconomic History   Marital status: Married     Spouse name: Not on file   Number of children: Not on file   Years of education: Not on file   Highest education level: Not on file  Occupational History   Occupation: Retired  Tobacco Use   Smoking status: Former    Current packs/day: 0.00    Types: Cigarettes    Quit date: 2016    Years since quitting: 9.8   Smokeless tobacco: Never  Vaping Use   Vaping status: Never Used  Substance and Sexual Activity   Alcohol use: Never   Drug use: Never   Sexual activity: Not Currently  Other Topics Concern   Not on file  Social History Narrative   Not on file   Social Drivers of Health   Financial Resource Strain: Low Risk  (05/11/2023)   Overall Financial Resource Strain (CARDIA)    Difficulty of Paying Living Expenses: Not hard at all  Food Insecurity: No Food Insecurity (05/11/2023)   Hunger Vital Sign    Worried About Running Out of Food in the Last Year: Never true    Ran Out of Food in the Last Year: Never true  Transportation Needs: No Transportation Needs (05/11/2023)   PRAPARE - Administrator, Civil Service (Medical): No    Lack of Transportation (Non-Medical): No  Physical Activity: Inactive (05/11/2023)   Exercise Vital Sign    Days of Exercise per Week: 0 days    Minutes of Exercise per Session: 0 min  Stress: No  Stress Concern Present (05/11/2023)   [employer] of Occupational Health - Occupational Stress Questionnaire    Feeling of Stress : Not at all  Social Connections: Moderately Integrated (05/11/2023)   Social Connection and Isolation Panel    Frequency of Communication with Friends and Family: More than three times a week    Frequency of Social Gatherings with Friends and Family: More than three times a week    Attends Religious Services: More than 4 times per year    Active Member of [employer] or Organizations: No    Attends Banker Meetings: Never    Marital Status: Married   CONSTITUTIONAL: see HPI E/N/T: Negative for  ear pain, nasal congestion and sore throat.  CARDIOVASCULAR: Negative for chest pain, dizziness, palpitations and pedal edema.  RESPIRATORY: Negative for recent cough and dyspnea.  GASTROINTESTINAL: Negative for abdominal pain, acid reflux symptoms, constipation, diarrhea, nausea and vomiting.  MSK: Negative for arthralgias and myalgias.  INTEGUMENTARY: Negative for rash.  NEUROLOGICAL: Negative for dizziness and headaches.  PSYCHIATRIC: Negative for sleep disturbance and to question depression screen.  Negative for depression, negative for anhedonia.       Objective:  PHYSICAL EXAM:   VS: BP 128/76   Pulse 79   Temp 97.8 F (36.6 C) (Temporal)   Resp 18   Ht 4' 11 (1.499 m)   Wt 184 lb 9.6 oz (83.7 kg)   SpO2 97%   BMI 37.28 kg/m   GEN: Well nourished, well developed, in no acute distress  Cardiac: RRR; no murmurs, Respiratory:  normal respiratory rate and pattern with no distress - normal breath sounds with no rales, rhonchi, wheezes or rubs MS: no deformity or atrophy  Skin: warm and dry, no rash  Neuro:  Alert and Oriented x 3,  - CN II-Xii grossly intact Psych: euthymic mood, appropriate affect and demeanor  Office Visit on 05/20/2024  Component Date Value Ref Range Status   TC 05/20/2024 163   Final   HDL 05/20/2024 47   Final   TRG 05/20/2024 82   Final   LDL 05/20/2024 100   Final   Non-HDL 05/20/2024 116   Final   TC/HDL 05/20/2024 2.1   Final    Lab Results  Component Value Date   WBC 5.9 11/17/2023   HGB 14.8 11/17/2023   HCT 46.2 11/17/2023   PLT 191 11/17/2023   GLUCOSE 85 11/17/2023   CHOL 159 11/17/2023   TRIG 102 11/17/2023   HDL 58 11/17/2023   LDLCALC 82 11/17/2023   ALT 24 11/17/2023   AST 26 11/17/2023   NA 142 11/17/2023   K 4.7 11/17/2023   CL 103 11/17/2023   CREATININE 0.95 11/17/2023   BUN 13 11/17/2023   CO2 22 11/17/2023   TSH 2.020 05/11/2023      Assessment & Plan:   Problem List Items Addressed This Visit        Cardiovascular and Mediastinum   Essential hypertension, benign (Chronic)   Relevant Medications   lisinopril  (ZESTRIL ) 10 MG tablet   Other Relevant Orders   CBC with Differential/Platelet   Comprehensive metabolic panel   TSH Continue current med     Other   Mixed hyperlipidemia - Primary   Relevant Medications   Continue pravachol  and watch diet   Other Relevant Orders   Lipid panel - stable results  Fatigue Labwork pending (microcytic anemia panel, cmp, tsh)    Other Visit Diagnoses                         .  Meds ordered this encounter  Medications   lisinopril  (ZESTRIL ) 10 MG tablet    Sig: Take 1 tablet (10 mg total) by mouth daily.    Dispense:  90 tablet    Refill:  1    Supervising Provider:   COX, KIRSTEN [phone]    Orders Placed This Encounter  Procedures   Fe+CBC/D/Plt+TIBC+Fer+Retic   Comprehensive metabolic panel with GFR   TSH   POCT Lipid Panel     Follow-up: Return in about 6 months (around 11/18/2024) for chronic fasting follow-up.  An After Visit Summary was printed and given to the patient.  CAMIE JONELLE NICHOLAUS DEVONNA [facility] [phone]

## 2024-05-21 ENCOUNTER — Ambulatory Visit: Payer: Self-pay | Admitting: Physician Assistant

## 2024-05-21 LAB — COMPREHENSIVE METABOLIC PANEL WITH GFR
ALT: 27 IU/L (ref 0–32)
AST: 28 IU/L (ref 0–40)
Albumin: 4.6 g/dL (ref 3.8–4.8)
Alkaline Phosphatase: 48 IU/L — ABNORMAL LOW (ref 49–135)
BUN/Creatinine Ratio: 16 (ref 12–28)
BUN: 14 mg/dL (ref 8–27)
Bilirubin Total: 0.4 mg/dL (ref 0.0–1.2)
CO2: 22 mmol/L (ref 20–29)
Calcium: 9.7 mg/dL (ref 8.7–10.3)
Chloride: 103 mmol/L (ref 96–106)
Creatinine, Ser: 0.9 mg/dL (ref 0.57–1.00)
Globulin, Total: 2.5 g/dL (ref 1.5–4.5)
Glucose: 95 mg/dL (ref 70–99)
Potassium: 4.6 mmol/L (ref 3.5–5.2)
Sodium: 141 mmol/L (ref 134–144)
Total Protein: 7.1 g/dL (ref 6.0–8.5)
eGFR: 67 mL/min/1.73 (ref >59)

## 2024-05-21 LAB — FE+CBC/D/PLT+TIBC+FER+RETIC
Basophils Absolute: 0 x10E3/uL (ref 0.0–0.2)
Basos: 1 %
EOS (ABSOLUTE): 0.2 x10E3/uL (ref 0.0–0.4)
Eos: 4 %
Ferritin: 89 ng/mL (ref 15–150)
Hematocrit: 44.6 % (ref 34.0–46.6)
Hemoglobin: 14.1 g/dL (ref 11.1–15.9)
Immature Grans (Abs): 0 x10E3/uL (ref 0.0–0.1)
Immature Granulocytes: 0 %
Iron Saturation: 25 % (ref 15–55)
Iron: 81 ug/dL (ref 27–139)
Lymphocytes Absolute: 2.1 x10E3/uL (ref 0.7–3.1)
Lymphs: 34 %
MCH: 28.5 pg (ref 26.6–33.0)
MCHC: 31.6 g/dL (ref 31.5–35.7)
MCV: 90 fL (ref 79–97)
Monocytes Absolute: 0.5 x10E3/uL (ref 0.1–0.9)
Monocytes: 9 %
Neutrophils Absolute: 3.3 x10E3/uL (ref 1.4–7.0)
Neutrophils: 52 %
Platelets: 197 x10E3/uL (ref 150–450)
RBC: 4.95 x10E6/uL (ref 3.77–5.28)
RDW: 12.7 % (ref 11.7–15.4)
Retic Ct Pct: 1.4 % (ref 0.6–2.6)
Total Iron Binding Capacity: 320 ug/dL (ref 250–450)
UIBC: 239 ug/dL (ref 118–369)
WBC: 6.2 x10E3/uL (ref 3.4–10.8)

## 2024-05-21 LAB — TSH: TSH: 2.13 u[IU]/mL (ref 0.450–4.500)

## 2024-05-22 ENCOUNTER — Ambulatory Visit: Payer: Self-pay | Admitting: Family Medicine

## 2024-08-08 ENCOUNTER — Ambulatory Visit

## 2024-08-08 VITALS — BP 126/71 | Ht 59.0 in | Wt 183.8 lb

## 2024-08-08 DIAGNOSIS — Z0000 Encounter for general adult medical examination without abnormal findings: Secondary | ICD-10-CM | POA: Diagnosis not present

## 2024-08-08 NOTE — Progress Notes (Signed)
 " I connected with  Megan Christian on 08/08/2024 by a audio enabled telemedicine application and verified that I am speaking with the correct person using two identifiers.  Patient Location: Home  Provider Location: Office/Clinic  Persons Participating in Visit: Patient.  I discussed the limitations of evaluation and management by telemedicine. The patient expressed understanding and agreed to proceed.  Vital Signs: Because this visit was a virtual/telehealth visit, some criteria may be missing or patient reported. Any vitals not documented were not able to be obtained and vitals that have been documented are patient reported.  Chief Complaint  Patient presents with   Medicare Wellness     Subjective:   Megan Christian is a 74 y.o. female who presents for a Medicare Annual Wellness Visit.  Visit info / Clinical Intake: Medicare Wellness Visit Type:: Subsequent Annual Wellness Visit Persons participating in visit and providing information:: patient Medicare Wellness Visit Mode:: Telephone If telephone:: video declined Since this visit was completed virtually, some vitals may be partially provided or unavailable. Missing vitals are due to the limitations of the virtual format.: Documented vitals are patient reported If Telephone or Video please confirm:: I connected with patient using audio/video enable telemedicine. I verified patient identity with two identifiers, discussed telehealth limitations, and patient agreed to proceed. Patient Location:: home Provider Location:: office/clinic Interpreter Needed?: No Pre-visit prep was completed: yes AWV questionnaire completed by patient prior to visit?: no Living arrangements:: lives with spouse/significant other Patient's Overall Health Status Rating: very good Typical amount of pain: none Does pain affect daily life?: no Are you currently prescribed opioids?: no  Dietary Habits and Nutritional Risks How many meals a day?: 2 Eats  fruit and vegetables daily?: yes Most meals are obtained by: eating out; preparing own meals In the last 2 weeks, have you had any of the following?: none Diabetic:: no  Functional Status Activities of Daily Living (to include ambulation/medication): Independent Ambulation: Independent Medication Administration: Independent Home Management (perform basic housework or laundry): Independent Manage your own finances?: yes Primary transportation is: driving Concerns about vision?: no *vision screening is required for WTM* Concerns about hearing?: no  Fall Screening Falls in the past year?: 0 Number of falls in past year: 0 Was there an injury with Fall?: 0 Fall Risk Category Calculator: 0 Patient Fall Risk Level: Low Fall Risk  Fall Risk Patient at Risk for Falls Due to: No Fall Risks Fall risk Follow up: Falls evaluation completed; Falls prevention discussed  Home and Transportation Safety: All rugs have non-skid backing?: yes All stairs or steps have railings?: yes Grab bars in the bathtub or shower?: yes Have non-skid surface in bathtub or shower?: yes Good home lighting?: yes Regular seat belt use?: yes Hospital stays in the last year:: no  Cognitive Assessment Difficulty concentrating, remembering, or making decisions? : yes Will 6CIT or Mini Cog be Completed: yes What year is it?: 0 points What month is it?: 0 points Give patient an address phrase to remember (5 components): remember apple , table, penny About what time is it?: 0 points Count backwards from 20 to 1: 0 points Say the months of the year in reverse: 0 points Repeat the address phrase from earlier: 0 points 6 CIT Score: 0 points  Advance Directives (For Healthcare) Does Patient Have a Medical Advance Directive?: No Would patient like information on creating a medical advance directive?: No - Patient declined  Reviewed/Updated  Reviewed/Updated: Reviewed All (Medical, Surgical, Family, Medications,  Allergies, Care Teams, Patient  Goals)    Allergies (verified) Patient has no known allergies.   Current Medications (verified) Outpatient Encounter Medications as of 08/08/2024  Medication Sig   lisinopril  (ZESTRIL ) 10 MG tablet Take 1 tablet (10 mg total) by mouth daily.   Multiple Vitamins-Iron (MULTIVITAMINS WITH IRON) TABS tablet Take 1 tablet by mouth daily.   pravastatin  (PRAVACHOL ) 40 MG tablet TAKE 1 TABLET(40 MG) BY MOUTH DAILY   No facility-administered encounter medications on file as of 08/08/2024.    History: History reviewed. No pertinent past medical history. Past Surgical History:  Procedure Laterality Date   NO PAST SURGERIES     Family History  Problem Relation Age of Onset   Emphysema Father    Diabetes Sister    Diabetes Brother    Breast cancer Neg Hx    Social History   Occupational History   Occupation: Retired  Tobacco Use   Smoking status: Former    Current packs/day: 0.00    Types: Cigarettes    Quit date: 2016    Years since quitting: 10.0   Smokeless tobacco: Never  Vaping Use   Vaping status: Never Used  Substance and Sexual Activity   Alcohol use: Never   Drug use: Never   Sexual activity: Not Currently   Tobacco Counseling Counseling given: Not Answered  SDOH Screenings   Food Insecurity: No Food Insecurity (08/08/2024)  Housing: Low Risk (08/08/2024)  Transportation Needs: No Transportation Needs (08/08/2024)  Utilities: Not At Risk (08/08/2024)  Alcohol Screen: Low Risk (05/11/2023)  Depression (PHQ2-9): Low Risk (08/08/2024)  Financial Resource Strain: Low Risk (05/11/2023)  Physical Activity: Patient Declined (08/08/2024)  Social Connections: Moderately Integrated (08/08/2024)  Stress: No Stress Concern Present (08/08/2024)  Tobacco Use: Medium Risk (08/08/2024)  Health Literacy: Adequate Health Literacy (08/08/2024)   See flowsheets for full screening details  Depression Screen PHQ 2 & 9 Depression Scale- Over the past 2 weeks, how  often have you been bothered by any of the following problems? Little interest or pleasure in doing things: 0 Feeling down, depressed, or hopeless (PHQ Adolescent also includes...irritable): 0 PHQ-2 Total Score: 0 Trouble falling or staying asleep, or sleeping too much: 0 Feeling tired or having little energy: 0 Poor appetite or overeating (PHQ Adolescent also includes...weight loss): 0 Feeling bad about yourself - or that you are a failure or have let yourself or your family down: 0 Trouble concentrating on things, such as reading the newspaper or watching television (PHQ Adolescent also includes...like school work): 0 Moving or speaking so slowly that other people could have noticed. Or the opposite - being so fidgety or restless that you have been moving around a lot more than usual: 0 Thoughts that you would be better off dead, or of hurting yourself in some way: 0 PHQ-9 Total Score: 0 If you checked off any problems, how difficult have these problems made it for you to do your work, take care of things at home, or get along with other people?: Not difficult at all  Depression Treatment Depression Interventions/Treatment : EYV7-0 Score <4 Follow-up Not Indicated     Goals Addressed               This Visit's Progress     Patient Stated (pt-stated)        Patient would like to lose some weight              Objective:    Today's Vitals   08/08/24 1107  BP: 126/71  Weight: 183  lb 12.8 oz (83.4 kg)  Height: 4' 11 (1.499 m)   Body mass index is 37.12 kg/m.  Hearing/Vision screen Hearing Screening - Comments:: No hearing difficulties  Vision Screening - Comments:: Patient states she has no difficulties  Immunizations and Health Maintenance Health Maintenance  Topic Date Due   COVID-19 Vaccine (3 - 2025-26 season) 04/01/2024   Medicare Annual Wellness (AWV)  06/06/2024   Zoster Vaccines- Shingrix (1 of 2) 08/20/2024 (Originally 10/15/1998)   Influenza Vaccine   10/29/2024 (Originally 03/01/2024)   DTaP/Tdap/Td (1 - Tdap) 11/16/2024 (Originally 10/15/1969)   Colonoscopy  02/10/2025   Mammogram  05/17/2025   Pneumococcal Vaccine: 50+ Years  Completed   Bone Density Scan  Completed   Meningococcal B Vaccine  Aged Out   Hepatitis C Screening  Discontinued        Assessment/Plan:  This is a routine wellness examination for Kimia.  Patient Care Team: Nicholaus Credit, DEVONNA as PCP - General (Physician Assistant)  I have personally reviewed and noted the following in the patients chart:   Medical and social history Use of alcohol, tobacco or illicit drugs  Current medications and supplements including opioid prescriptions. Functional ability and status Nutritional status Physical activity Advanced directives List of other physicians Hospitalizations, surgeries, and ER visits in previous 12 months Vitals Screenings to include cognitive, depression, and falls Referrals and appointments  No orders of the defined types were placed in this encounter.  In addition, I have reviewed and discussed with patient certain preventive protocols, quality metrics, and best practice recommendations. A written personalized care plan for preventive services as well as general preventive health recommendations were provided to patient.   Lyle MARLA Right, [place]   08/08/2024   No follow-ups on file.  After Visit Summary: (MyChart) Due to this being a telephonic visit, the after visit summary with patients personalized plan was offered to patient via MyChart   No voiced or noted concerns at this time Patient advised to keep follow-up appointment with PCP (11/27/2024) Vaccines not given: covid vaccination declined today  "

## 2024-08-08 NOTE — Patient Instructions (Signed)
 Megan Christian,  Thank you for taking the time for your Medicare Wellness Visit. I appreciate your continued commitment to your health goals. Please review the care plan we discussed, and feel free to reach out if I can assist you further.  Please note that Annual Wellness Visits do not include a physical exam. Some assessments may be limited, especially if the visit was conducted virtually. If needed, we may recommend an in-person follow-up with your provider.  Ongoing Care Seeing your primary care provider every 3 to 6 months helps us  monitor your health and provide consistent, personalized care.   Referrals If a referral was made during today's visit and you haven't received any updates within two weeks, please contact the referred provider directly to check on the status.  Recommended Screenings:  Health Maintenance  Topic Date Due   COVID-19 Vaccine (3 - 2025-26 season) 04/01/2024   Medicare Annual Wellness Visit  06/06/2024   Zoster (Shingles) Vaccine (1 of 2) 08/20/2024*   Flu Shot  10/29/2024*   DTaP/Tdap/Td vaccine (1 - Tdap) 11/16/2024*   Colon Cancer Screening  02/10/2025   Breast Cancer Screening  05/17/2025   Pneumococcal Vaccine for age over 13  Completed   Osteoporosis screening with Bone Density Scan  Completed   Meningitis B Vaccine  Aged Out   Hepatitis C Screening  Discontinued  *Topic was postponed. The date shown is not the original due date.       02/08/2022   12:59 PM  Advanced Directives  Does Patient Have a Medical Advance Directive? No  Would patient like information on creating a medical advance directive? No - Patient declined    Vision: Annual vision screenings are recommended for early detection of glaucoma, cataracts, and diabetic retinopathy. These exams can also reveal signs of chronic conditions such as diabetes and high blood pressure.  Dental: Annual dental screenings help detect early signs of oral cancer, gum disease, and other conditions  linked to overall health, including heart disease and diabetes.  Please see the attached documents for additional preventive care recommendations.

## 2024-09-05 ENCOUNTER — Other Ambulatory Visit: Payer: Self-pay | Admitting: Physician Assistant

## 2024-09-05 DIAGNOSIS — I10 Essential (primary) hypertension: Secondary | ICD-10-CM

## 2024-11-27 ENCOUNTER — Ambulatory Visit: Admitting: Physician Assistant

## 2025-05-21 ENCOUNTER — Encounter
